# Patient Record
Sex: Female | Born: 1985 | Race: Black or African American | Hispanic: No | Marital: Single | Smoking: Former smoker
Health system: Southern US, Community
[De-identification: ages and names within clinical notes are randomized; demographics above are authoritative.]

## PROBLEM LIST (undated history)

## (undated) ENCOUNTER — Inpatient Hospital Stay (HOSPITAL_COMMUNITY): Payer: Self-pay

## (undated) DIAGNOSIS — F329 Major depressive disorder, single episode, unspecified: Secondary | ICD-10-CM

## (undated) DIAGNOSIS — F32A Depression, unspecified: Secondary | ICD-10-CM

## (undated) DIAGNOSIS — IMO0001 Reserved for inherently not codable concepts without codable children: Secondary | ICD-10-CM

## (undated) DIAGNOSIS — O009 Unspecified ectopic pregnancy without intrauterine pregnancy: Secondary | ICD-10-CM

## (undated) DIAGNOSIS — N920 Excessive and frequent menstruation with regular cycle: Secondary | ICD-10-CM

## (undated) DIAGNOSIS — Z5189 Encounter for other specified aftercare: Secondary | ICD-10-CM

## (undated) DIAGNOSIS — D649 Anemia, unspecified: Secondary | ICD-10-CM

## (undated) HISTORY — DX: Anemia, unspecified: D64.9

## (undated) HISTORY — PX: INDUCED ABORTION: SHX677

---
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
## 2007-09-09 ENCOUNTER — Inpatient Hospital Stay (HOSPITAL_COMMUNITY): Admission: EM | Admit: 2007-09-09 | Discharge: 2007-09-13 | Payer: Self-pay | Admitting: Emergency Medicine

## 2007-10-24 ENCOUNTER — Emergency Department (HOSPITAL_COMMUNITY): Admission: EM | Admit: 2007-10-24 | Discharge: 2007-10-24 | Payer: Self-pay | Admitting: Emergency Medicine

## 2009-05-16 IMAGING — CT CT PELVIS W/ CM
1 of 3 series · 14 of 32 positions shown, 19 images · IV contrast (APPLIED)
Comparison: None

CT ABDOMEN

CLINICAL DATA: Vaginal bleeding, fever, elevated white count

CT ABDOMEN AND PELVIS WITH CONTRAST
TECHNIQUE: Multidetector CT imaging of the abdomen and pelvis was
performed using the standard protocol following bolus
administration of intravenous contrast.
Contrast: 100 ml [id number]

[Series 2: abd/pelv with 5.0 b31f st · axial · 0.71mm/px · z∈[-752,-312]mm · 14 of 98 slices shown, 19 images]
[im 5/98  soft-tissue]
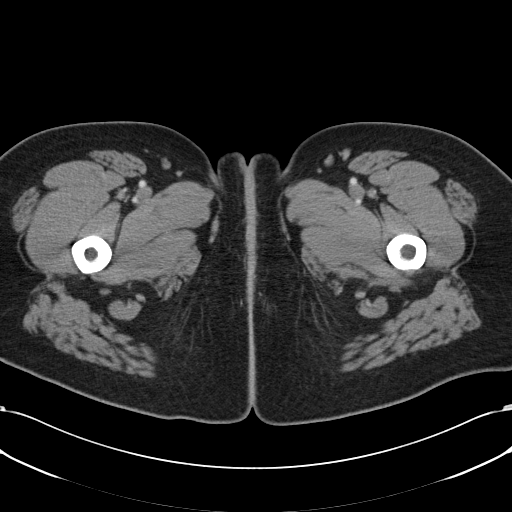
[im 5/98  bone]
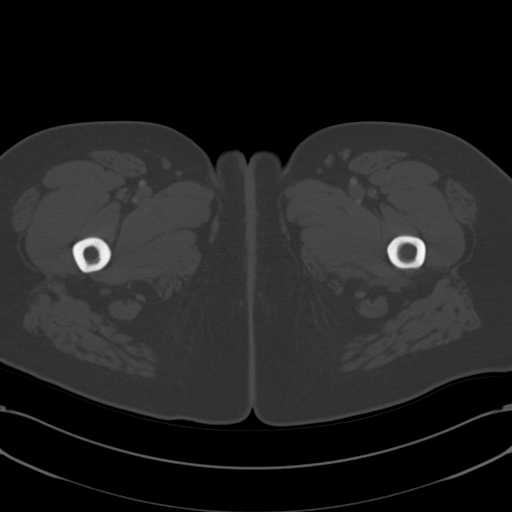
[im 15/98  soft-tissue]
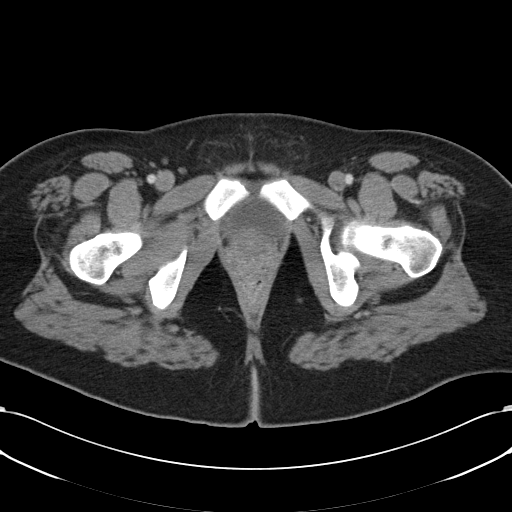
[im 20/98  soft-tissue]
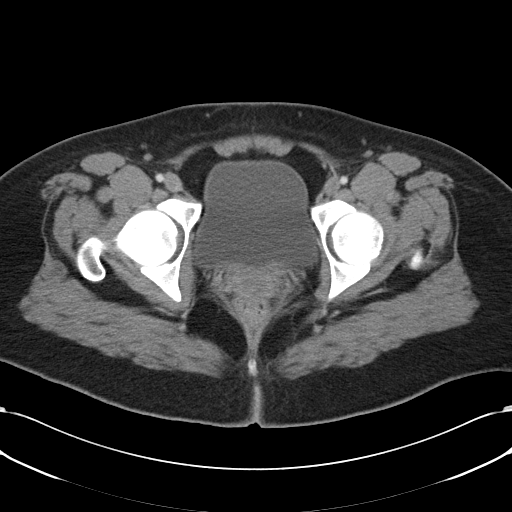
[im 30/98  soft-tissue]
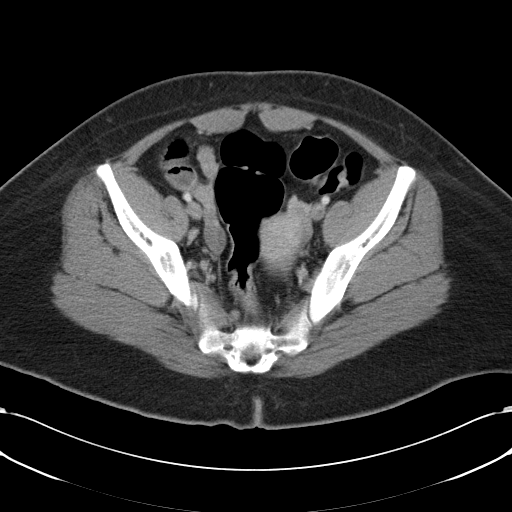
[im 34/98  soft-tissue]
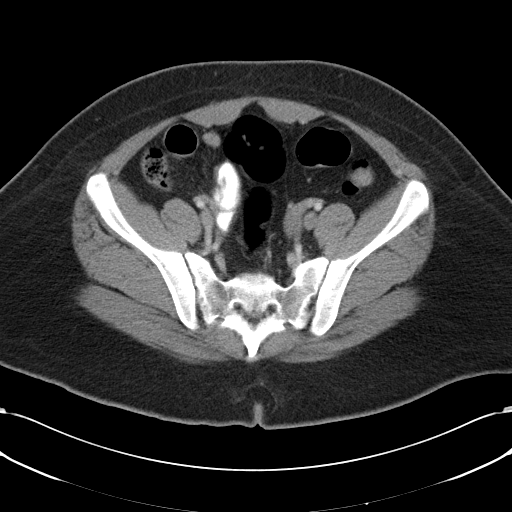
[im 44/98  soft-tissue]
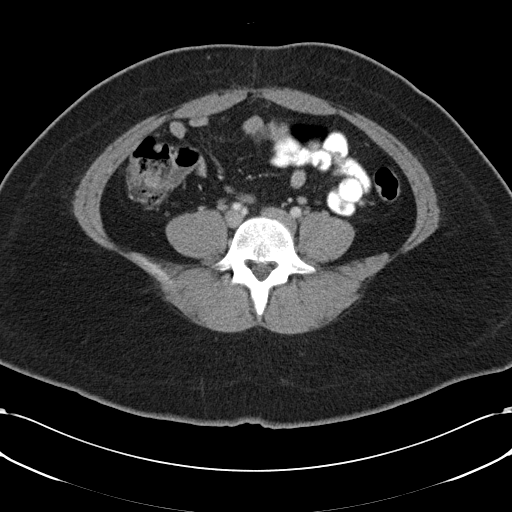
[im 49/98  soft-tissue]
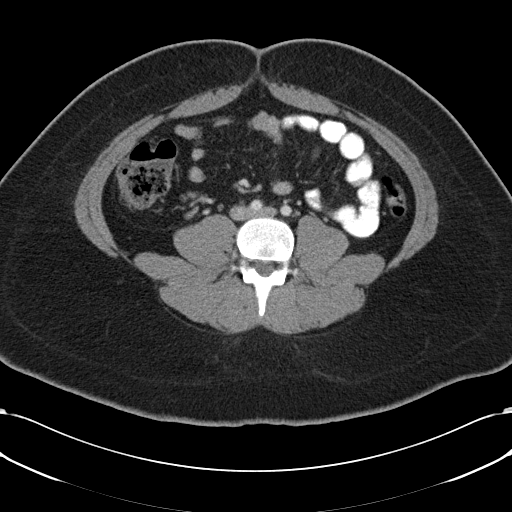
[im 54/98  soft-tissue]
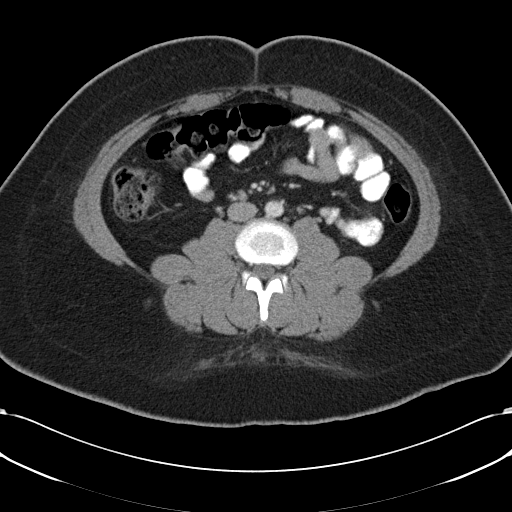
[im 64/98  soft-tissue]
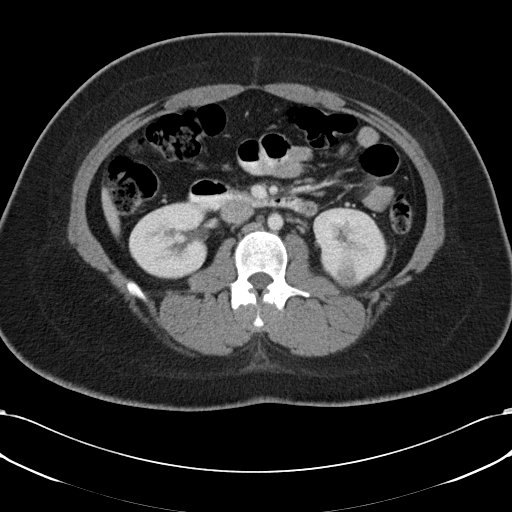
[im 64/98  bone]
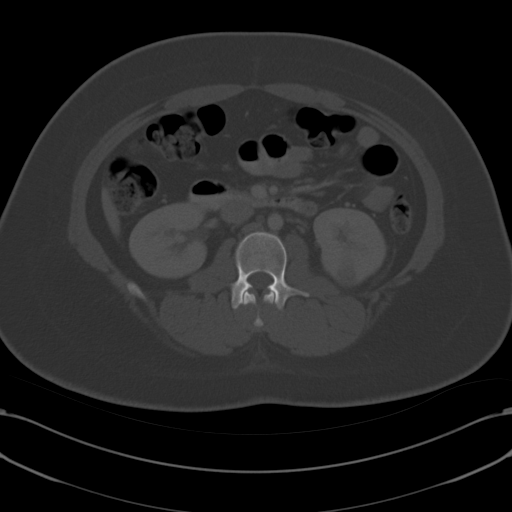
[im 68/98  soft-tissue]
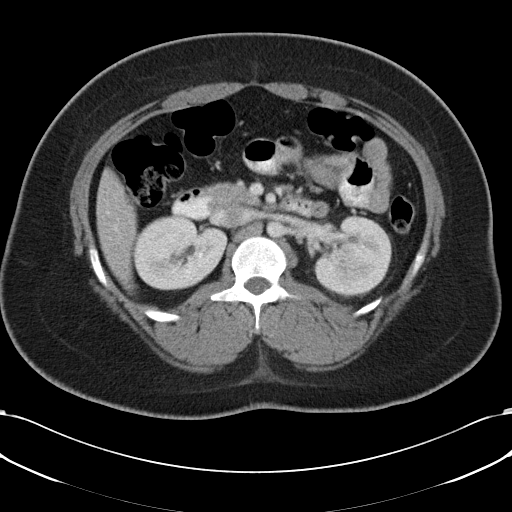
[im 78/98  soft-tissue]
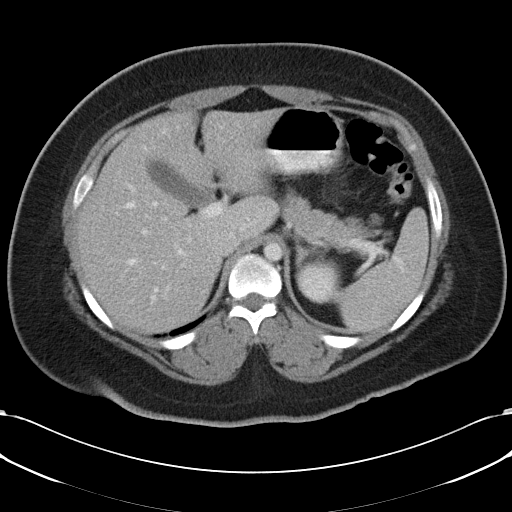
[im 78/98  lung]
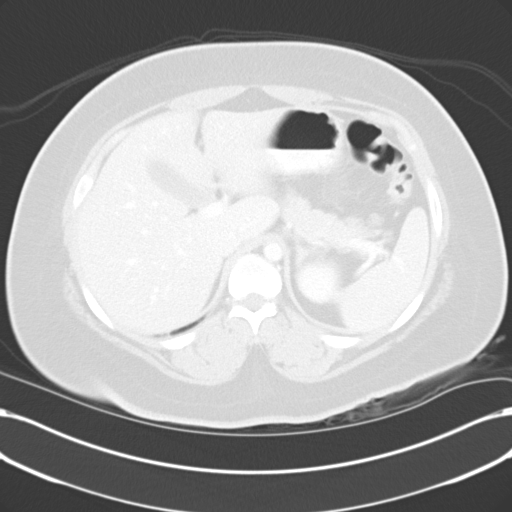
[im 83/98  soft-tissue]
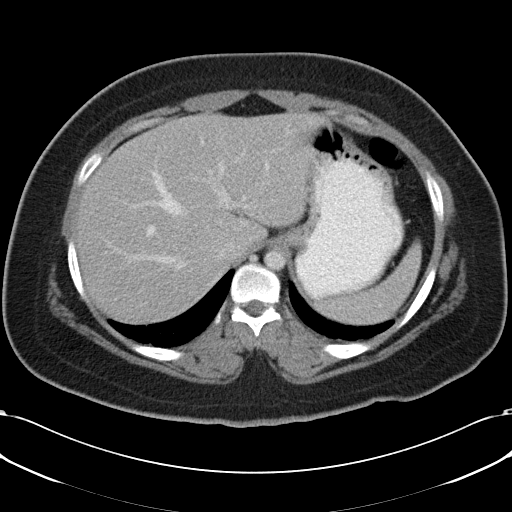
[im 83/98  lung]
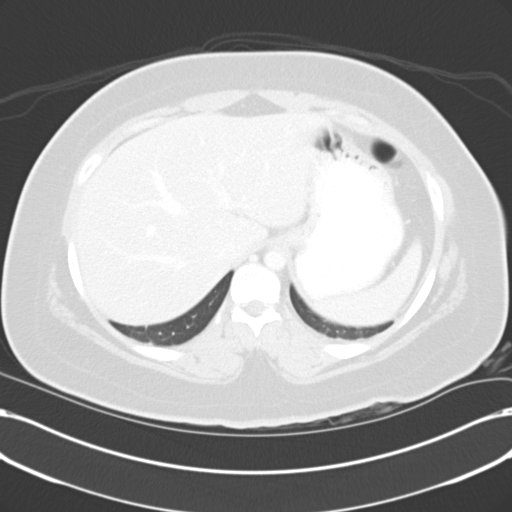
[im 88/98  lung]
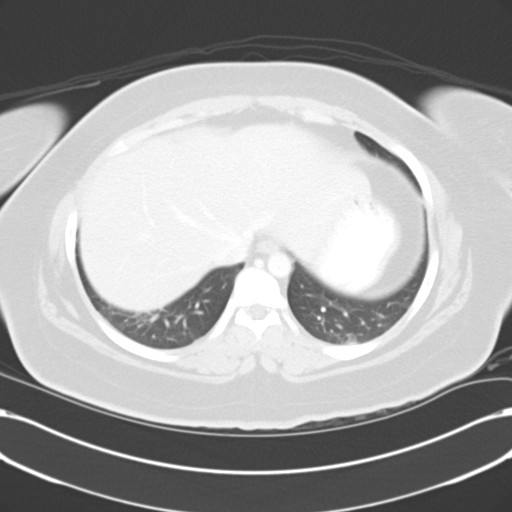
[im 93/98  soft-tissue]
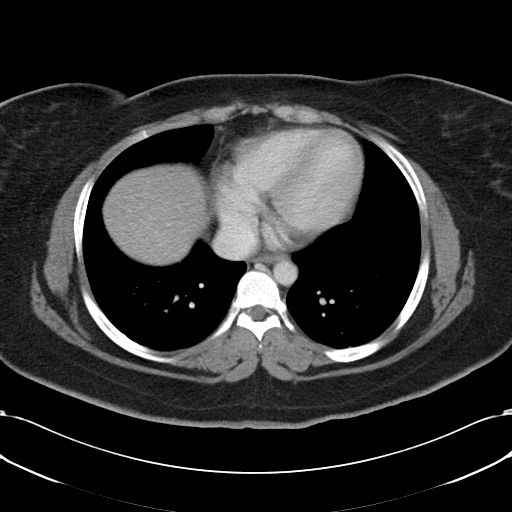
[im 93/98  lung]
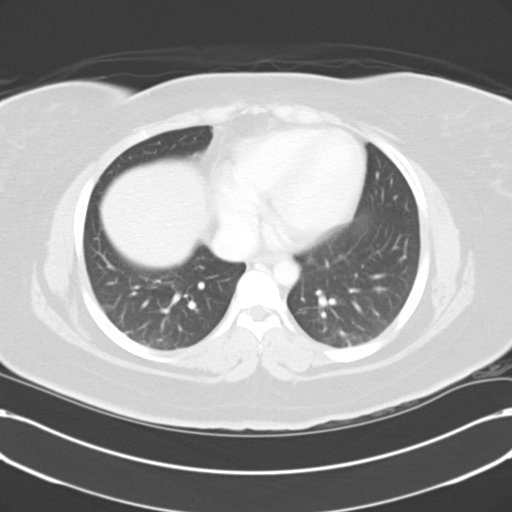

[14 of 32 positions shown; findings below may reference images not displayed]

FINDINGS: There are areas of poor enhancement within the left
kidney, with striated nephrogram pattern, both in the upper pole
and lower pole region.  Surrounding perinephric stranding.
Findings compatible with pyelonephritis.  Right kidney
unremarkable. Liver, spleen, pancreas, adrenals, unremarkable. No
biliary ductal dilatation. Gallbladder grossly unremarkable.

Bowel grossly unremarkable.  No free fluid, free air, or
adenopathy.

Minimal patchy ground-glass densities in the lung bases, likely
atelectasis.  Heart is normal size.  No effusions.  No acute bony
abnormality.
IMPRESSION: Areas of decreased enhancement within the left kidney, with
surrounding perinephric stranding, compatible with pyelonephritis.

CT PELVIS
FINDINGS: Appendix is visualized and is normal.  Fluid seen within
the endometrium.  Mild prominence of the cervix.  Urinary bladder
unremarkable.

Bowel grossly unremarkable.  No free fluid, free air, or
adenopathy.

No acute bony abnormality
IMPRESSION: Endometrial fluid present.  Mild prominence of the cervix.
Recommend correlation with pelvic exam/direct visualization.

## 2009-09-07 ENCOUNTER — Emergency Department (HOSPITAL_COMMUNITY): Admission: EM | Admit: 2009-09-07 | Discharge: 2009-09-07 | Payer: Self-pay | Admitting: Family Medicine

## 2010-07-27 NOTE — H&P (Signed)
[name], [name]             ACCOUNT NO.:  [id number]   MEDICAL RECORD NO.:  [id number]          PATIENT TYPE:  INP   LOCATION:  5151                         FACILITY:  [facility]   PHYSICIAN:  Della Goo, M.D. DATE OF BIRTH:  1985/09/05   DATE OF ADMISSION:  09/08/2007  DATE OF DISCHARGE:                              HISTORY & PHYSICAL   PRIMARY CARE PHYSICIAN:  Unassigned.   CHIEF COMPLAINT:  Vaginal bleeding and abdominal and flank pain.   HISTORY OF PRESENT ILLNESS:  This is a 25 year old female who presents  to the emergency department with complaints of 3 weeks of severe vaginal  bleeding and abdominal pain along with left-sided flank pain.  The  patient reports soaking up to seven pads daily during this time.  She  also reports having dysuria.  She denies having any temperatures;  however, in the emergency department, she was found to have a  temperature of 102.5.   The patient was evaluated in the emergency department and had a GYN  examination performed by the EDP.  Also, a wet prep was performed, and  GC and chlamydia cultures were also sent.   PAST MEDICAL HISTORY:  None.   PAST SURGICAL HISTORY:  None.   MEDICATIONS:  None.   ALLERGIES:  NO KNOWN DRUG ALLERGIES.   SOCIAL HISTORY:  The patient is a nonsmoker, nondrinker.   FAMILY HISTORY:  Noncontributory.   REVIEW OF SYSTEMS:  Pertinents are mentioned above.   PHYSICAL EXAMINATION FINDINGS:  GENERAL:  This is a 25 year old thin,  well-nourished, well-developed female in discomfort but no acute  distress.  VITAL SIGNS:  Her vital signs initially were temperature 102.5, blood  pressure 95/47, heart rate 111, respirations 22, O2 saturations 96-99%.  HEENT:  Normocephalic, atraumatic.  There is no scleral icterus.  Pupils  equally round, reactive to light.  Extraocular movements are intact.  Funduscopic benign.  Oropharynx is clear.  NECK:  Supple.  Full range of motion.  No thyromegaly, adenopathy or  jugular venous distention.  CARDIOVASCULAR:  Mild tachycardia.  No murmurs, gallops or rubs.  LUNGS:  Clear to auscultation bilaterally.  ABDOMEN:  Positive bowel sounds, soft, mildly tender in the left lower  quadrant area.  No rebound, no guarding.  No hepatosplenomegaly.  BACK:  Positive left-sided costovertebral angle tenderness.  No spinous  process tenderness.  EXTREMITIES:  Without cyanosis, clubbing or edema.  NEUROLOGIC:  Nonfocal.  GENITOURINARY:  Examination performed by EDP.   LABORATORY STUDIES:  White blood cell count 17.6, hemoglobin 10.4,  hematocrit 31.1, platelets 391.  Sodium 138, potassium 3.8, chloride  104, bicarbonate 22, BUN 7, creatinine 0.8 and glucose 116.  Urinalysis  with moderate hemoglobin, positive nitrites and moderate leukocyte  esterase.  Urine HCG negative.  Wet prep performed; however, large blood  clots present and vaginal blood in vaginal vault.  CT of the abdomen and  pelvis positive for pyelonephritis on the left side.   ASSESSMENT:  A 25 year old female being admitted with:  1. Pyelonephritis.  2. Abdominal pain/pelvic pain.  3. Sepsis.  4. Menorrhagia.  5. Anemia.   PLAN:  The patient will be admitted.  She has received a dose of  antibiotic therapy with cefoxitin 2 grams IV x1 in the EDP which should  cover any gonorrheal infection.  IV antibiotic therapy of doxycycline  will be continued, and Cipro and Flagyl therapy will be added.  IV  fluids have been ordered for maintenance and fluid resuscitation.  Pain  control therapy will also be administered as needed.  The patient will  be placed on GI and DVT prophylaxis, and GYN will be consulted for  further evaluation of the patient's dysmenorrhea and menorrhagia.      Della Goo, M.D.  Electronically Signed     HJ/MEDQ  D:  09/09/2007  T:  09/09/2007  Job:  [id number]

## 2010-07-27 NOTE — Discharge Summary (Signed)
[name], KIVETT NO.:  [id number]   MEDICAL RECORD NO.:  [id number]          PATIENT TYPE:  INP   LOCATION:  5149                         FACILITY:  [facility]   PHYSICIAN:  Wendy Scott, MD     DATE OF BIRTH:  1985-06-19   DATE OF ADMISSION:  09/08/2007  DATE OF DISCHARGE:  09/13/2007                               DISCHARGE SUMMARY   PRIMARY MEDICAL DOCTOR:  [name].   DISCHARGE DIAGNOSES:  1. Acute pyelonephritis.  2. Symptomatic severe anemia secondary to menorrhagia.  3. 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  4. Genital Chlamydia and gonococcus.   DISCHARGE MEDICATIONS:  1. Ferrous sulfate 325 mg p.o. b.i.d.  2. Cipro 500 mg p.o. b.i.d.  3. Doxycycline 100 mg p.o. b.i.d.   PROCEDURES:  1. CT of the abdomen with contrast:  Areas of decreased enhancement      within the left kidney with surrounding perinephric stranding,      compatible with pyelonephritis.  2. CT of the pelvis:  Endometrial fluid presenting mild prominence of      the cervix.   PERTINENT LABS:  Urine culture, no growth.  H&H 10.5/30.8.  Blood  cultures are negative to date.  RPR negative.  GC probe genital,  positive.  Chlamydia probe genital, positive.  TSH 0.882.  Anemia panel  with absolute reticulocytes 74.1.  Iron less than 10.  B12 365.  Serum  folate 14.2, ferritin 119.  Urinalysis on admission with 7-10 white  blood cells per high-powered field and many bacteria.  Wet prep was  negative for yeast, Trichomonas, or clue cells.  Urine pregnancy test  negative.   CONSULTATIONS:  OB/GYN, Dr. Zelphia Oconnor.   HOSPITAL COURSE/PATIENT DISPOSITION:  Please refer to the history and  physical note for initial admission details.  In summary, [name] is  a pleasant 25 year old African American female patient with no past  medical history, who presented with three weeks of severe vaginal  bleeding and abdominal pain to the left flank region.  She also had  dysuria and  a history of menorrhagia.  In the emergency room, she had a  high temperature of 102.5 degrees Fahrenheit.  Further evaluation  revealed a white cell count of 17.1.  Urinalysis suggestive of urinary  tract infection and a CT abdomen and pelvis also suggesting left  pyelonephritis.  The patient was thereby admitted for further evaluation  and management.   1. Acute pyelonephritis:  Patient was admitted to the hospital.  She      was placed on empiric antibiotics initially of doxycycline,      ciprofloxacin, and Flagyl IV.  She was also hydrated with IV      fluids.  With these measures, the patient has progressively done      well.  Patient is to complete total of 7 days of oral      ciprofloxacin.   1. Symptomatic severe anemia:  This was most likely secondary to her      menorrhagia with hemoglobins dropping to as low as 8.9.  Patient      was transfused 2 units  of packed red blood cells, and H&H's are      stable.  She is started on iron supplements.   1. Menorrhagia secondary to chronic anovulation:  OB/GYN was      consulted.  They suggested this was secondary to chronic      anovulation.  The patient was provided a dose of Depo-Provera.  Her      vaginal bleeding is much better, and she has spotting at times now.      She is to follow up with Dr. Renaldo Oconnor as an outpatient for Pap smear      and further workup and management.  She has been started on iron      supplements.   1. Genital Chlamydia & GC positive:  After discussing with OB/GYN, the      patient was provided with a single dose of IM ceftriaxone 250 mg.      She is to complete a total 14-day course of oral doxycycline.   Patient does not have a primary medical doctor.  We will provide the  name of a physician for her to follow up with a CBC.  Patient is also to  follow up with OB/GYN.      Wendy Scott, MD  Electronically Signed     AH/MEDQ  D:  09/13/2007  T:  09/13/2007  Job:  [id number]   cc:   [name], M.D.  Wendy Cairo, MD

## 2010-11-15 ENCOUNTER — Inpatient Hospital Stay (INDEPENDENT_AMBULATORY_CARE_PROVIDER_SITE_OTHER)
Admission: RE | Admit: 2010-11-15 | Discharge: 2010-11-15 | Disposition: A | Discharge status: Home / Self Care | Payer: Self-pay | Source: Ambulatory Visit | Attending: Family Medicine | Admitting: Family Medicine

## 2010-11-15 DIAGNOSIS — J029 Acute pharyngitis, unspecified: Secondary | ICD-10-CM

## 2010-12-09 LAB — URINALYSIS, ROUTINE W REFLEX MICROSCOPIC
Bilirubin Urine: NEGATIVE
Bilirubin Urine: NEGATIVE
Ketones, ur: 15 — AB
Nitrite: NEGATIVE
Nitrite: POSITIVE — AB
Specific Gravity, Urine: 1.007
Specific Gravity, Urine: 1.024
Urobilinogen, UA: 1
Urobilinogen, UA: 1
pH: 6
pH: 6

## 2010-12-09 LAB — CBC
HCT: 23 — ABNORMAL LOW
HCT: 23.6 — ABNORMAL LOW
HCT: 31.1 — ABNORMAL LOW
HCT: 31.2 — ABNORMAL LOW
Hemoglobin: 10.4 — ABNORMAL LOW
Hemoglobin: 8 — ABNORMAL LOW
MCHC: 33.2
MCHC: 33.5
MCHC: 33.8
MCHC: 35
MCV: 75.3 — ABNORMAL LOW
MCV: 76.2 — ABNORMAL LOW
MCV: 77.3 — ABNORMAL LOW
MCV: 79.2
Platelets: 274
Platelets: 291
Platelets: 319
Platelets: 391
RBC: 3.09 — ABNORMAL LOW
RBC: 4.14
RDW: 15.2
RDW: 15.4
WBC: 13.9 — ABNORMAL HIGH
WBC: 17.6 — ABNORMAL HIGH

## 2010-12-09 LAB — CROSSMATCH: Antibody Screen: NEGATIVE

## 2010-12-09 LAB — URINALYSIS, MICROSCOPIC ONLY
Bilirubin Urine: NEGATIVE
Glucose, UA: NEGATIVE
Ketones, ur: NEGATIVE
Nitrite: NEGATIVE
Protein, ur: 30 — AB
Specific Gravity, Urine: 1.018
Urobilinogen, UA: 1
pH: 6

## 2010-12-09 LAB — DIFFERENTIAL
Basophils Relative: 0
Eosinophils Relative: 0
Monocytes Absolute: 1.6 — ABNORMAL HIGH
Monocytes Relative: 12
Neutro Abs: 10.5 — ABNORMAL HIGH

## 2010-12-09 LAB — POCT I-STAT, CHEM 8
BUN: 7
Calcium, Ion: 1.14
Chloride: 104
Creatinine, Ser: 0.8
Glucose, Bld: 116 — ABNORMAL HIGH
HCT: 26 — ABNORMAL LOW
Hemoglobin: 8.8 — ABNORMAL LOW
Potassium: 3.8
Sodium: 138
TCO2: 22

## 2010-12-09 LAB — URINE MICROSCOPIC-ADD ON

## 2010-12-09 LAB — BASIC METABOLIC PANEL
BUN: 4 — ABNORMAL LOW
CO2: 22
CO2: 26
Calcium: 8.1 — ABNORMAL LOW
Chloride: 103
Chloride: 105
Creatinine, Ser: 0.73
GFR calc Af Amer: >60
Glucose, Bld: 87
Glucose, Bld: 95
Potassium: 3.6
Sodium: 137

## 2010-12-09 LAB — CULTURE, BLOOD (ROUTINE X 2)
Culture: NO GROWTH
Culture: NO GROWTH
Culture: NO GROWTH
Culture: NO GROWTH

## 2010-12-09 LAB — WET PREP, GENITAL
Clue Cells Wet Prep HPF POC: NONE SEEN
Trich, Wet Prep: NONE SEEN
Yeast Wet Prep HPF POC: NONE SEEN

## 2010-12-09 LAB — GC/CHLAMYDIA PROBE AMP, GENITAL
Chlamydia, DNA Probe: POSITIVE — AB
GC Probe Amp, Genital: POSITIVE — AB

## 2010-12-09 LAB — HEMOGLOBIN AND HEMATOCRIT, BLOOD
HCT: 30 — ABNORMAL LOW
Hemoglobin: 8.2 — ABNORMAL LOW

## 2010-12-09 LAB — URINE CULTURE

## 2010-12-09 LAB — TSH: TSH: 0.882

## 2010-12-09 LAB — IRON AND TIBC
Iron: <10 — ABNORMAL LOW
UIBC: 273

## 2010-12-09 LAB — VITAMIN B12: Vitamin B-12: 365 (ref 211–911)

## 2010-12-09 LAB — POCT PREGNANCY, URINE
Operator id: 151321
Preg Test, Ur: NEGATIVE

## 2010-12-09 LAB — TRANSFUSION REACTION
DAT C3: NEGATIVE
Post RXN DAT IgG: NEGATIVE

## 2010-12-09 LAB — ABO/RH: ABO/RH(D): AB POS

## 2010-12-09 LAB — RETICULOCYTES: Retic Ct Pct: 2.1

## 2010-12-09 LAB — RPR: RPR Ser Ql: NONREACTIVE

## 2010-12-09 LAB — FERRITIN: Ferritin: 119 (ref 10–291)

## 2010-12-10 LAB — URINALYSIS, ROUTINE W REFLEX MICROSCOPIC
Glucose, UA: NEGATIVE
Hgb urine dipstick: NEGATIVE
Protein, ur: NEGATIVE
Specific Gravity, Urine: 1.022
pH: 5.5

## 2010-12-10 LAB — POCT PREGNANCY, URINE: Preg Test, Ur: NEGATIVE

## 2010-12-24 LAB — POCT H PYLORI SCREEN: H. PYLORI SCREEN, POC: NEGATIVE

## 2011-03-15 HISTORY — PX: WISDOM TOOTH EXTRACTION: SHX21

## 2011-04-04 ENCOUNTER — Inpatient Hospital Stay (HOSPITAL_COMMUNITY): Admission: AD | Admit: 2011-04-04 | Payer: Self-pay | Source: Ambulatory Visit | Admitting: Obstetrics & Gynecology

## 2011-09-18 ENCOUNTER — Inpatient Hospital Stay (HOSPITAL_COMMUNITY)
Admission: AD | Admit: 2011-09-18 | Discharge: 2011-09-18 | Disposition: A | Discharge status: Home / Self Care | Payer: Medicaid Other | Source: Ambulatory Visit | Attending: Obstetrics & Gynecology | Admitting: Obstetrics & Gynecology

## 2011-09-18 ENCOUNTER — Inpatient Hospital Stay (HOSPITAL_COMMUNITY): Payer: Medicaid Other

## 2011-09-18 ENCOUNTER — Encounter (HOSPITAL_COMMUNITY): Payer: Self-pay | Admitting: *Deleted

## 2011-09-18 DIAGNOSIS — N390 Urinary tract infection, site not specified: Secondary | ICD-10-CM | POA: Insufficient documentation

## 2011-09-18 DIAGNOSIS — O98819 Other maternal infectious and parasitic diseases complicating pregnancy, unspecified trimester: Secondary | ICD-10-CM | POA: Insufficient documentation

## 2011-09-18 DIAGNOSIS — N760 Acute vaginitis: Secondary | ICD-10-CM

## 2011-09-18 DIAGNOSIS — R1031 Right lower quadrant pain: Secondary | ICD-10-CM | POA: Insufficient documentation

## 2011-09-18 DIAGNOSIS — B9689 Other specified bacterial agents as the cause of diseases classified elsewhere: Secondary | ICD-10-CM | POA: Insufficient documentation

## 2011-09-18 DIAGNOSIS — A5901 Trichomonal vulvovaginitis: Secondary | ICD-10-CM

## 2011-09-18 DIAGNOSIS — O2340 Unspecified infection of urinary tract in pregnancy, unspecified trimester: Secondary | ICD-10-CM

## 2011-09-18 DIAGNOSIS — A499 Bacterial infection, unspecified: Secondary | ICD-10-CM | POA: Insufficient documentation

## 2011-09-18 DIAGNOSIS — O2390 Unspecified genitourinary tract infection in pregnancy, unspecified trimester: Secondary | ICD-10-CM | POA: Insufficient documentation

## 2011-09-18 HISTORY — DX: Encounter for other specified aftercare: Z51.89

## 2011-09-18 HISTORY — DX: Reserved for inherently not codable concepts without codable children: IMO0001

## 2011-09-18 LAB — WET PREP, GENITAL: Yeast Wet Prep HPF POC: NONE SEEN

## 2011-09-18 LAB — CBC
HCT: 35.2 % — ABNORMAL LOW (ref 36.0–46.0)
Hemoglobin: 11.7 g/dL — ABNORMAL LOW (ref 12.0–15.0)
MCHC: 33.2 g/dL (ref 30.0–36.0)
MCV: 78.9 fL (ref 78.0–100.0)
RDW: 14.1 % (ref 11.5–15.5)

## 2011-09-18 LAB — URINALYSIS, ROUTINE W REFLEX MICROSCOPIC
Nitrite: POSITIVE — AB
Protein, ur: NEGATIVE mg/dL
Specific Gravity, Urine: >1.03 — ABNORMAL HIGH (ref 1.005–1.030)
Urobilinogen, UA: 0.2 mg/dL (ref 0.0–1.0)

## 2011-09-18 LAB — POCT PREGNANCY, URINE: Preg Test, Ur: POSITIVE — AB

## 2011-09-18 LAB — URINE MICROSCOPIC-ADD ON

## 2011-09-18 LAB — HCG, QUANTITATIVE, PREGNANCY: hCG, Beta Chain, Quant, S: 28511 m[IU]/mL — ABNORMAL HIGH (ref <5)

## 2011-09-18 MED ORDER — CEPHALEXIN 500 MG PO CAPS: 500.0000 mg | ORAL_CAPSULE | Freq: Four times a day (QID) | ORAL | Status: AC

## 2011-09-18 MED ORDER — METRONIDAZOLE 500 MG PO TABS: 500.0000 mg | ORAL_TABLET | Freq: Two times a day (BID) | ORAL | Status: AC

## 2011-09-18 NOTE — MAU Note (Signed)
Pt reports "i am having abd pains", reports pain off/on for 2 days and then constant today. Positive home preg test . LMP 07/07/2011 (?)

## 2011-09-18 NOTE — MAU Provider Note (Signed)
History     CSN: [id number]  Arrival date and time: 09/18/11 1928   None     Chief Complaint  Patient presents with  . Abdominal Pain   HPI A 26-year-old G2 P0 010 at 10 weeks and 3 days who presents the MAU with two-day onset of cramping and nonradiating abdominal pain located in right lower quadrant. The pain rated as 5/10 and intermittent yesterday which is progressed to constant today. No palliating or provoking factors. Patient denies new sexual partners. Patient has a positive home pregnancy test with a LMP of 07/07/2011 giving her an EGA of [redacted] weeks and 3 days.  OB History    Grav Para Term Preterm Abortions TAB SAB Ect Mult Living   2    1 1           Past Medical History  Diagnosis Date  . Blood transfusion     Past Surgical History  Procedure Date  . Induced abortion     Family History  Problem Relation Age of Onset  . Arthritis Father   . Diabetes Father   . Hypertension Father   . Heart disease Father   . Arthritis Maternal Grandmother   . Depression Maternal Grandmother   . Hypertension Maternal Grandmother   . Heart disease Maternal Grandmother   . Depression Maternal Grandfather   . Arthritis Paternal Grandmother   . Cancer Paternal Grandmother   . Depression Paternal Grandmother   . Depression Paternal Grandfather     History  Substance Use Topics  . Smoking status: Never Smoker   . Smokeless tobacco: Not on file  . Alcohol Use: No    Allergies: Allergies not on file  No prescriptions prior to admission    Review of Systems  Constitutional: Negative for fever, chills and diaphoresis.  Gastrointestinal: Negative for nausea, vomiting, diarrhea, constipation, blood in stool and melena.  Genitourinary: Negative for dysuria and urgency.       She denies vaginal discharge, bleeding.   Physical Exam   Blood pressure 122/64, pulse 68, temperature 99 F (37.2 C), temperature source Oral, resp. rate 18, height 5\' 5"  (1.651 m), weight 82.555 kg  (182 lb), last menstrual period 07/07/2011, SpO2 100.00%.  Physical Exam  Constitutional: She is oriented to person, place, and time. She appears well-developed and well-nourished.  HENT:  Head: Normocephalic and atraumatic.  GI: Soft. She exhibits no distension and no mass. There is tenderness (Right lower quadrant tenderness). There is no rebound and no guarding.  Genitourinary:       Normal external female genitalia. Normal-appearing cervix. White vaginal discharge. Right adnexal tenderness. Negative cervical motion tenderness.  Neurological: She is alert and oriented to person, place, and time.  Skin: Skin is warm and dry.  Psychiatric: She has a normal mood and affect. Her behavior is normal. Judgment and thought content normal.   Results for orders placed during the hospital encounter of 09/18/11 (from the past 24 hour(s))  URINALYSIS, ROUTINE W REFLEX MICROSCOPIC     Status: Abnormal   Collection Time   09/18/11  7:45 PM      Component Value Range   Color, Urine YELLOW  YELLOW   APPearance HAZY (*) CLEAR   Specific Gravity, Urine >1.030 (*) 1.005 - 1.030   pH 6.0  5.0 - 8.0   Glucose, UA NEGATIVE  NEGATIVE mg/dL   Hgb urine dipstick NEGATIVE  NEGATIVE   Bilirubin Urine NEGATIVE  NEGATIVE   Ketones, ur NEGATIVE  NEGATIVE mg/dL  Protein, ur NEGATIVE  NEGATIVE mg/dL   Urobilinogen, UA 0.2  0.0 - 1.0 mg/dL   Nitrite POSITIVE (*) NEGATIVE   Leukocytes, UA SMALL (*) NEGATIVE  URINE MICROSCOPIC-ADD ON     Status: Abnormal   Collection Time   09/18/11  7:45 PM      Component Value Range   Squamous Epithelial / LPF MANY (*) RARE   WBC, UA 7-10  <3 WBC/hpf   Bacteria, UA MANY (*) RARE   Urine-Other MUCOUS PRESENT    POCT PREGNANCY, URINE     Status: Abnormal   Collection Time   09/18/11  7:47 PM      Component Value Range   Preg Test, Ur POSITIVE (*) NEGATIVE  WET PREP, GENITAL     Status: Abnormal   Collection Time   09/18/11  8:05 PM      Component Value Range   Yeast Wet Prep  HPF POC NONE SEEN  NONE SEEN   Trich, Wet Prep FEW (*) NONE SEEN   Clue Cells Wet Prep HPF POC FEW (*) NONE SEEN   WBC, Wet Prep HPF POC FEW (*) NONE SEEN  CBC     Status: Abnormal   Collection Time   09/18/11  8:16 PM      Component Value Range   WBC 13.5 (*) 4.0 - 10.5 K/uL   RBC 4.46  3.87 - 5.11 MIL/uL   Hemoglobin 11.7 (*) 12.0 - 15.0 g/dL   HCT 16.1 (*) 09.6 - 04.5 %   MCV 78.9  78.0 - 100.0 fL   MCH 26.2  26.0 - 34.0 pg   MCHC 33.2  30.0 - 36.0 g/dL   RDW 40.9  81.1 - 91.4 %   Platelets 362  150 - 400 K/uL   *RADIOLOGY REPORT*  Clinical Data: Abdominal pain. Positive pregnancy test.  OBSTETRIC <14 WK [place] AND TRANSVAGINAL OB US  Technique: Both transabdominal and transvaginal ultrasound  examinations were performed for complete evaluation of the  gestation as well as the maternal uterus, adnexal regions, and  pelvic cul-de-sac. Transvaginal technique was performed to assess  early pregnancy.  Comparison: None.  Intrauterine gestational sac: Fundal and round.  Yolk sac: Present  Embryo: Present  Cardiac Activity: Present  Heart Rate: 163 bpm  MSD: mm w d  CRL: 49.2 mm 11 w five d [place] EDC: 04/04/2011  Maternal uterus/adnexae:  Ovaries are within normal limits. No subchorionic hemorrhage. No  free fluid.  IMPRESSION:  Live single intrauterine gestation with an estimated gestational  age of [redacted] weeks and 5 days. Fetal heart rate is 163 beats per  minute  Original Report Authenticated By: Donavan Burnet, M.D.   MAU Course  Procedures  MDM White count normal. Ultrasound showing intrauterine pregnancy. Wet prep positive for Trichomonas and bacterial vaginosis. Urinalysis positive for UTI. Assessment and Plan  #1 trichomonas vaginalis #2 urinary tract infection #3 bacterial vaginosis  Flagyl prescribed for Trichomonas and bacterial vaginosis. Will prescribe Keflex for UTI. Patient sent home with instructions to follow the symptoms worsen.   STINSON, JACOB  JEHIEL 09/18/2011, 8:13 PM

## 2011-09-21 ENCOUNTER — Telehealth (HOSPITAL_COMMUNITY): Payer: Self-pay | Admitting: Obstetrics & Gynecology

## 2011-09-21 NOTE — Telephone Encounter (Signed)
Telephone call to patient regarding positive chlamydia culture, patient not in, left message for her to call.  Patient has not been treated and will need referral to [place] [facility] at [phone].  Report faxed to health department.  

## 2011-10-05 ENCOUNTER — Other Ambulatory Visit (HOSPITAL_COMMUNITY): Payer: Self-pay | Admitting: Nurse Practitioner

## 2011-10-05 DIAGNOSIS — Z3689 Encounter for other specified antenatal screening: Secondary | ICD-10-CM

## 2011-10-05 LAB — OB RESULTS CONSOLE VARICELLA ZOSTER ANTIBODY, IGG: Varicella: IMMUNE

## 2011-11-02 ENCOUNTER — Ambulatory Visit (HOSPITAL_COMMUNITY)
Admission: RE | Admit: 2011-11-02 | Discharge: 2011-11-02 | Disposition: A | Discharge status: Home / Self Care | Payer: Medicaid Other | Source: Ambulatory Visit | Attending: Nurse Practitioner | Admitting: Nurse Practitioner

## 2011-11-02 DIAGNOSIS — O358XX0 Maternal care for other (suspected) fetal abnormality and damage, not applicable or unspecified: Secondary | ICD-10-CM | POA: Insufficient documentation

## 2011-11-02 DIAGNOSIS — Z363 Encounter for antenatal screening for malformations: Secondary | ICD-10-CM | POA: Insufficient documentation

## 2011-11-02 DIAGNOSIS — Z3689 Encounter for other specified antenatal screening: Secondary | ICD-10-CM

## 2011-11-02 DIAGNOSIS — Z1389 Encounter for screening for other disorder: Secondary | ICD-10-CM | POA: Insufficient documentation

## 2011-11-26 ENCOUNTER — Encounter (HOSPITAL_COMMUNITY): Payer: Self-pay | Admitting: *Deleted

## 2011-11-26 ENCOUNTER — Inpatient Hospital Stay (HOSPITAL_COMMUNITY)
Admission: AD | Admit: 2011-11-26 | Discharge: 2011-11-26 | Disposition: A | Discharge status: Home / Self Care | Payer: Medicaid Other | Source: Ambulatory Visit | Attending: Obstetrics | Admitting: Obstetrics

## 2011-11-26 DIAGNOSIS — N949 Unspecified condition associated with female genital organs and menstrual cycle: Secondary | ICD-10-CM

## 2011-11-26 DIAGNOSIS — L293 Anogenital pruritus, unspecified: Secondary | ICD-10-CM | POA: Insufficient documentation

## 2011-11-26 DIAGNOSIS — R109 Unspecified abdominal pain: Secondary | ICD-10-CM | POA: Insufficient documentation

## 2011-11-26 DIAGNOSIS — B373 Candidiasis of vulva and vagina: Secondary | ICD-10-CM

## 2011-11-26 DIAGNOSIS — O2390 Unspecified genitourinary tract infection in pregnancy, unspecified trimester: Secondary | ICD-10-CM | POA: Insufficient documentation

## 2011-11-26 DIAGNOSIS — B3731 Acute candidiasis of vulva and vagina: Secondary | ICD-10-CM | POA: Insufficient documentation

## 2011-11-26 LAB — URINALYSIS, ROUTINE W REFLEX MICROSCOPIC
Glucose, UA: NEGATIVE mg/dL
Hgb urine dipstick: NEGATIVE
Ketones, ur: NEGATIVE mg/dL
pH: 6 (ref 5.0–8.0)

## 2011-11-26 LAB — WET PREP, GENITAL
Trich, Wet Prep: NONE SEEN
Yeast Wet Prep HPF POC: NONE SEEN

## 2011-11-26 MED ORDER — MICONAZOLE NITRATE 2 % VA CREA: 1.0000 | TOPICAL_CREAM | Freq: Every day | VAGINAL | Status: AC

## 2011-11-26 MED ORDER — FLUCONAZOLE 150 MG PO TABS
150.0000 mg | ORAL_TABLET | ORAL | Status: AC
  Administered 2011-11-26: 150 mg via ORAL
  Filled 2011-11-26: qty 1

## 2011-11-26 NOTE — Progress Notes (Signed)
Toco d/ced by Sharen Counter CNM

## 2011-11-26 NOTE — MAU Note (Signed)
Awoke to sharp pain in lower abd. Went to BR and then started having a lot of itching down there. Abd. Pain comes and goes but itching is getting worse.

## 2011-11-26 NOTE — MAU Provider Note (Signed)
Chief Complaint:  Abdominal Pain and Vaginal Itching   First Provider Initiated Contact with Patient 11/26/11 [phone]      HPI: [name] is a 26 y.o. G2P0010 at 2w4dwho presents to maternity admissions reporting single episode of sharp inguinal pain which woke her up from sleep.  This pain resolved after she went to the restroom but then she started having severe vaginal itching. She reports no new sexual partners but reports she is not sure if her partner is faithful to her.  She reports good fetal movement, denies LOF, vaginal bleeding, urinary symptoms, h/a, dizziness, n/v, or fever/chills.    Pregnancy Course:  Pt diagnosed with chlamydia during this pregnancy and she and partner were treated at health dept.  Past Medical History: Past Medical History  Diagnosis Date  . Blood transfusion     Past obstetric history:   Past Surgical History: Past Surgical History  Procedure Date  . Induced abortion     Family History: Family History  Problem Relation Age of Onset  . Arthritis Father   . Diabetes Father   . Hypertension Father   . Heart disease Father   . Arthritis Maternal Grandmother   . Depression Maternal Grandmother   . Hypertension Maternal Grandmother   . Heart disease Maternal Grandmother   . Depression Maternal Grandfather   . Arthritis Paternal Grandmother   . Cancer Paternal Grandmother   . Depression Paternal Grandmother   . Depression Paternal Grandfather     Social History: History  Substance Use Topics  . Smoking status: Never Smoker   . Smokeless tobacco: Not on file  . Alcohol Use: No    Allergies: No Known Allergies  Meds:  Prescriptions prior to admission  Medication Sig Dispense Refill  . Prenatal Vit-Fe Fumarate-FA (PRENATAL MULTIVITAMIN) TABS Take 1 tablet by mouth daily.      . benzocaine (ORAJEL) 10 % mucosal gel Use as directed 1 application in the mouth or throat as needed. For tooth pain        ROS: Pertinent findings in  history of present illness.  Physical Exam  Blood pressure 117/72, pulse 61, temperature 98 F (36.7 C), temperature source Oral, resp. rate 20, height 5\' 2"  (1.575 m), weight 84.641 kg (186 lb 9.6 oz), last menstrual period 07/07/2011. GENERAL: Well-developed, well-nourished female in no acute distress.  HEENT: normocephalic HEART: normal rate RESP: normal effort ABDOMEN: Soft, non-tender, gravid appropriate for gestational age EXTREMITIES: Nontender, no edema NEURO: alert and oriented Pelvic exam: Cervix pink, visually closed, without lesion, moderate amount thick yellow discharge, vaginal walls and external genitalia normal  Dilation: Closed/long/high Exam by:: Collene Gobble -Kirby CNM     Labs: Results for orders placed during the hospital encounter of 11/26/11 (from the past 24 hour(s))  URINALYSIS, ROUTINE W REFLEX MICROSCOPIC     Status: Abnormal   Collection Time   11/26/11  6:15 AM      Component Value Range   Color, Urine YELLOW  YELLOW   APPearance HAZY (*) CLEAR   Specific Gravity, Urine 1.020  1.005 - 1.030   pH 6.0  5.0 - 8.0   Glucose, UA NEGATIVE  NEGATIVE mg/dL   Hgb urine dipstick NEGATIVE  NEGATIVE   Bilirubin Urine NEGATIVE  NEGATIVE   Ketones, ur NEGATIVE  NEGATIVE mg/dL   Protein, ur NEGATIVE  NEGATIVE mg/dL   Urobilinogen, UA 0.2  0.0 - 1.0 mg/dL   Nitrite NEGATIVE  NEGATIVE   Leukocytes, UA SMALL (*) NEGATIVE  URINE  MICROSCOPIC-ADD ON     Status: Abnormal   Collection Time   11/26/11  6:15 AM      Component Value Range   Squamous Epithelial / LPF MANY (*) RARE   WBC, UA 3-6  <3 WBC/hpf   RBC / HPF 0-2  <3 RBC/hpf   Bacteria, UA FEW (*) RARE  WET PREP, GENITAL     Status: Abnormal   Collection Time   11/26/11  6:50 AM      Component Value Range   Yeast Wet Prep HPF POC NONE SEEN  NONE SEEN   Trich, Wet Prep NONE SEEN  NONE SEEN   Clue Cells Wet Prep HPF POC FEW (*) NONE SEEN   WBC, Wet Prep HPF POC FEW (*) NONE SEEN     ED Course Diflucan  150 mg PO x1 dose in MAU  Assessment: 1. Round ligament pain   2. Vaginal candida     Plan: Discharge home GC/Chlamydia pending Urine culture pending PTL precautions given Warm bath, ice, tylenol for round ligament pain Drink plenty of fluids Discussed risks of STDs in pregnancy, importance of prevention with condoms if partner unfaithful F/U with Dr Gaynell Face Return to MAU as needed    Sharen Counter Certified Nurse-Midwife 11/26/2011 6:59 AM

## 2011-11-27 LAB — URINE CULTURE: Colony Count: 75000

## 2011-11-28 LAB — GC/CHLAMYDIA PROBE AMP, GENITAL: GC Probe Amp, Genital: NEGATIVE

## 2011-12-13 ENCOUNTER — Encounter (HOSPITAL_COMMUNITY): Payer: Self-pay | Admitting: *Deleted

## 2011-12-13 ENCOUNTER — Inpatient Hospital Stay (HOSPITAL_COMMUNITY)
Admission: AD | Admit: 2011-12-13 | Discharge: 2011-12-14 | Disposition: A | Discharge status: Home / Self Care | Payer: Medicaid Other | Source: Ambulatory Visit | Attending: Obstetrics | Admitting: Obstetrics

## 2011-12-13 DIAGNOSIS — O26899 Other specified pregnancy related conditions, unspecified trimester: Secondary | ICD-10-CM

## 2011-12-13 DIAGNOSIS — R12 Heartburn: Secondary | ICD-10-CM | POA: Insufficient documentation

## 2011-12-13 DIAGNOSIS — O99891 Other specified diseases and conditions complicating pregnancy: Secondary | ICD-10-CM | POA: Insufficient documentation

## 2011-12-13 DIAGNOSIS — R109 Unspecified abdominal pain: Secondary | ICD-10-CM | POA: Insufficient documentation

## 2011-12-13 LAB — URINALYSIS, ROUTINE W REFLEX MICROSCOPIC
Bilirubin Urine: NEGATIVE
Hgb urine dipstick: NEGATIVE
Ketones, ur: NEGATIVE mg/dL
Protein, ur: NEGATIVE mg/dL
Urobilinogen, UA: 0.2 mg/dL (ref 0.0–1.0)

## 2011-12-13 NOTE — MAU Provider Note (Signed)
History     CSN: [id number]  Arrival date and time: 12/13/11 2200   First Provider Initiated Contact with Patient 12/13/11 2322      No chief complaint on file.  HPI [name] 26 y.o. [redacted]w[redacted]d Comes to MAU with blood on tissue when wiping after voiding and upper midline abdominal pain.  OB History    Grav Para Term Preterm Abortions TAB SAB Ect Mult Living   2    1 1     0      Past Medical History  Diagnosis Date  . Blood transfusion     Past Surgical History  Procedure Date  . Induced abortion     Family History  Problem Relation Age of Onset  . Arthritis Father   . Diabetes Father   . Hypertension Father   . Heart disease Father   . Arthritis Maternal Grandmother   . Depression Maternal Grandmother   . Hypertension Maternal Grandmother   . Heart disease Maternal Grandmother   . Depression Maternal Grandfather   . Arthritis Paternal Grandmother   . Cancer Paternal Grandmother   . Depression Paternal Grandmother   . Depression Paternal Grandfather     History  Substance Use Topics  . Smoking status: Never Smoker   . Smokeless tobacco: Not on file  . Alcohol Use: No    Allergies: No Known Allergies  Prescriptions prior to admission  Medication Sig Dispense Refill  . Prenatal Vit-Fe Fumarate-FA (PRENATAL MULTIVITAMIN) TABS Take 1 tablet by mouth daily.      Marland Kitchen acetaminophen-codeine (TYLENOL #3) 300-30 MG per tablet Take 2 tablets by mouth 2 (two) times daily as needed. For pain        Review of Systems  Gastrointestinal: Positive for abdominal pain. Negative for nausea and vomiting.  Genitourinary:       No vaginal discharge. Vaginal bleeding. No dysuria.   Physical Exam   Blood pressure 114/62, pulse 86, temperature 98 F (36.7 C), temperature source Oral, resp. rate 18, height 5\' 3"  (1.6 m), weight 87.147 kg (192 lb 2 oz), last menstrual period 07/07/2011.  Physical Exam  Nursing note and vitals reviewed. Constitutional: She is oriented  to person, place, and time. She appears well-developed and well-nourished.  HENT:  Head: Normocephalic.  Eyes: EOM are normal.  Neck: Neck supple.  GI: Soft. There is no tenderness.       Pain has improved at present, but was having upper midline abdominal pain  Genitourinary:       Speculum exam - no blood seen Cervix closed and thick  Musculoskeletal: Normal range of motion.  Neurological: She is alert and oriented to person, place, and time.  Skin: Skin is warm and dry.  Psychiatric: She has a normal mood and affect.    MAU Course  Procedures Results for orders placed during the hospital encounter of 12/13/11 (from the past 24 hour(s))  URINALYSIS, ROUTINE W REFLEX MICROSCOPIC     Status: Abnormal   Collection Time   12/13/11 10:15 PM      Component Value Range   Color, Urine YELLOW  YELLOW   APPearance CLEAR  CLEAR   Specific Gravity, Urine >1.030 (*) 1.005 - 1.030   pH 6.0  5.0 - 8.0   Glucose, UA NEGATIVE  NEGATIVE mg/dL   Hgb urine dipstick NEGATIVE  NEGATIVE   Bilirubin Urine NEGATIVE  NEGATIVE   Ketones, ur NEGATIVE  NEGATIVE mg/dL   Protein, ur NEGATIVE  NEGATIVE mg/dL   Urobilinogen,  UA 0.2  0.0 - 1.0 mg/dL   Nitrite NEGATIVE  NEGATIVE   Leukocytes, UA NEGATIVE  NEGATIVE    Assessment and Plan  Heartburn in pregnancy  Plan Drink at least 8 8-oz glasses of water every day. Take Tums by mouth for upper abdominal pain. Do not eat high fat foods. Keep your appointments in the office.  Snow Peoples 12/13/2011, 11:42 PM

## 2011-12-13 NOTE — Progress Notes (Signed)
Pt states is no longer having pain in upper abd that brought her in. Now just has heartburn which has a lot and is diff from pain that brought her in.

## 2011-12-13 NOTE — MAU Note (Signed)
PT SAYS  AT  930PM- SHE WAS LYING DOWN-- SHE STARTED HAVING  UPPER ABD PAIN-- NOW IT IS LESS.Marland Kitchen  SAYS WHEN SHE WIPED - SHE SAW A SPOT OF  BLOOD ON PAPER.Marland Kitchen  LAST SEX-  1 MTH AGO.  DENIES HSV AND  MRSA

## 2012-02-18 ENCOUNTER — Encounter (HOSPITAL_COMMUNITY): Payer: Self-pay | Admitting: *Deleted

## 2012-02-18 ENCOUNTER — Inpatient Hospital Stay (HOSPITAL_COMMUNITY)
Admission: AD | Admit: 2012-02-18 | Discharge: 2012-02-18 | Disposition: A | Discharge status: Home / Self Care | Payer: Medicaid Other | Source: Ambulatory Visit | Attending: Obstetrics | Admitting: Obstetrics

## 2012-02-18 ENCOUNTER — Inpatient Hospital Stay (HOSPITAL_COMMUNITY): Payer: Medicaid Other

## 2012-02-18 DIAGNOSIS — O479 False labor, unspecified: Secondary | ICD-10-CM

## 2012-02-18 DIAGNOSIS — R1084 Generalized abdominal pain: Secondary | ICD-10-CM

## 2012-02-18 DIAGNOSIS — O99891 Other specified diseases and conditions complicating pregnancy: Secondary | ICD-10-CM | POA: Insufficient documentation

## 2012-02-18 DIAGNOSIS — R109 Unspecified abdominal pain: Secondary | ICD-10-CM | POA: Insufficient documentation

## 2012-02-18 DIAGNOSIS — S3991XA Unspecified injury of abdomen, initial encounter: Secondary | ICD-10-CM

## 2012-02-18 DIAGNOSIS — IMO0002 Reserved for concepts with insufficient information to code with codable children: Secondary | ICD-10-CM | POA: Insufficient documentation

## 2012-02-18 LAB — URINALYSIS, ROUTINE W REFLEX MICROSCOPIC
Bilirubin Urine: NEGATIVE
Glucose, UA: NEGATIVE mg/dL
Hgb urine dipstick: NEGATIVE
Ketones, ur: NEGATIVE mg/dL
Protein, ur: NEGATIVE mg/dL
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
## 2012-03-27 ENCOUNTER — Inpatient Hospital Stay (HOSPITAL_COMMUNITY)
Admission: AD | Admit: 2012-03-27 | Discharge: 2012-03-31 | DRG: 766 | Disposition: A | Discharge status: Home / Self Care | Payer: Medicaid Other | Source: Ambulatory Visit | Attending: Obstetrics | Admitting: Obstetrics

## 2012-03-27 ENCOUNTER — Encounter (HOSPITAL_COMMUNITY): Payer: Self-pay | Admitting: Obstetrics and Gynecology

## 2012-03-27 DIAGNOSIS — D62 Acute posthemorrhagic anemia: Secondary | ICD-10-CM | POA: Diagnosis not present

## 2012-03-27 HISTORY — DX: Excessive and frequent menstruation with regular cycle: N92.0

## 2012-03-27 LAB — CBC
HCT: 34.6 % — ABNORMAL LOW (ref 36.0–46.0)
MCH: 24.2 pg — ABNORMAL LOW (ref 26.0–34.0)
MCHC: 32.7 g/dL (ref 30.0–36.0)
MCV: 74.2 fL — ABNORMAL LOW (ref 78.0–100.0)
Platelets: 308 10*3/uL (ref 150–400)
RDW: 15.5 % (ref 11.5–15.5)

## 2012-03-27 LAB — POCT FERN TEST: POCT Fern Test: POSITIVE

## 2012-03-27 LAB — OB RESULTS CONSOLE RUBELLA ANTIBODY, IGM: Rubella: IMMUNE

## 2012-03-27 LAB — TYPE AND SCREEN: Antibody Screen: NEGATIVE

## 2012-03-27 LAB — OB RESULTS CONSOLE HEPATITIS B SURFACE ANTIGEN: Hepatitis B Surface Ag: NEGATIVE

## 2012-03-27 MED ORDER — ONDANSETRON HCL 4 MG/2ML IJ SOLN: 4.0000 mg | Freq: Four times a day (QID) | INTRAMUSCULAR | Status: DC | PRN

## 2012-03-27 MED ORDER — EPHEDRINE 5 MG/ML INJ
INTRAVENOUS | Status: AC
  Filled 2012-03-27: qty 4

## 2012-03-27 MED ORDER — OXYTOCIN BOLUS FROM INFUSION: 500.0000 mL | INTRAVENOUS | Status: DC

## 2012-03-27 MED ORDER — PHENYLEPHRINE 40 MCG/ML (10ML) SYRINGE FOR IV PUSH (FOR BLOOD PRESSURE SUPPORT): 80.0000 ug | PREFILLED_SYRINGE | INTRAVENOUS | Status: DC | PRN

## 2012-03-27 MED ORDER — BUTORPHANOL TARTRATE 1 MG/ML IJ SOLN
1.0000 mg | INTRAMUSCULAR | Status: DC | PRN
  Administered 2012-03-27 (×2): 1 mg via INTRAVENOUS
  Filled 2012-03-27 (×2): qty 1

## 2012-03-27 MED ORDER — LIDOCAINE HCL (PF) 1 % IJ SOLN: 30.0000 mL | INTRAMUSCULAR | Status: DC | PRN

## 2012-03-27 MED ORDER — OXYTOCIN 40 UNITS IN LACTATED RINGERS INFUSION - SIMPLE MED
1.0000 m[IU]/min | INTRAVENOUS | Status: DC
  Administered 2012-03-27: 2 m[IU]/min via INTRAVENOUS

## 2012-03-27 MED ORDER — PHENYLEPHRINE 40 MCG/ML (10ML) SYRINGE FOR IV PUSH (FOR BLOOD PRESSURE SUPPORT)
PREFILLED_SYRINGE | INTRAVENOUS | Status: AC
  Filled 2012-03-27: qty 5

## 2012-03-27 MED ORDER — LACTATED RINGERS IV SOLN
INTRAVENOUS | Status: DC
  Administered 2012-03-27: 22:00:00 via INTRAVENOUS
  Administered 2012-03-27: 125 mL/h via INTRAVENOUS
  Administered 2012-03-27: 15:00:00 via INTRAVENOUS

## 2012-03-27 MED ORDER — OXYTOCIN 40 UNITS IN LACTATED RINGERS INFUSION - SIMPLE MED: 62.5000 mL/h | INTRAVENOUS | Status: DC

## 2012-03-27 MED ORDER — OXYCODONE-ACETAMINOPHEN 5-325 MG PO TABS: 1.0000 | ORAL_TABLET | ORAL | Status: DC | PRN

## 2012-03-27 MED ORDER — FENTANYL 2.5 MCG/ML BUPIVACAINE 1/10 % EPIDURAL INFUSION (WH - ANES)
14.0000 mL/h | INTRAMUSCULAR | Status: DC
  Administered 2012-03-27 – 2012-03-28 (×3): 14 mL/h via EPIDURAL
  Filled 2012-03-27 (×2): qty 125

## 2012-03-27 MED ORDER — FLEET ENEMA 7-19 GM/118ML RE ENEM: 1.0000 | ENEMA | RECTAL | Status: DC | PRN

## 2012-03-27 MED ORDER — OXYTOCIN 40 UNITS IN LACTATED RINGERS INFUSION - SIMPLE MED
INTRAVENOUS | Status: AC
  Administered 2012-03-27: 2 m[IU]/min via INTRAVENOUS
  Filled 2012-03-27: qty 1000

## 2012-03-27 MED ORDER — ACETAMINOPHEN 325 MG PO TABS: 650.0000 mg | ORAL_TABLET | ORAL | Status: DC | PRN

## 2012-03-27 MED ORDER — EPHEDRINE 5 MG/ML INJ: 10.0000 mg | INTRAVENOUS | Status: DC | PRN

## 2012-03-27 MED ORDER — IBUPROFEN 600 MG PO TABS: 600.0000 mg | ORAL_TABLET | Freq: Four times a day (QID) | ORAL | Status: DC | PRN

## 2012-03-27 MED ORDER — CITRIC ACID-SODIUM CITRATE 334-500 MG/5ML PO SOLN
30.0000 mL | ORAL | Status: DC | PRN
  Administered 2012-03-27 – 2012-03-28 (×2): 30 mL via ORAL
  Filled 2012-03-27 (×2): qty 15

## 2012-03-27 MED ORDER — DIPHENHYDRAMINE HCL 50 MG/ML IJ SOLN: 12.5000 mg | INTRAMUSCULAR | Status: DC | PRN

## 2012-03-27 MED ORDER — LACTATED RINGERS IV SOLN
500.0000 mL | INTRAVENOUS | Status: DC | PRN
  Administered 2012-03-28: 500 mL via INTRAVENOUS

## 2012-03-27 MED ORDER — LACTATED RINGERS IV SOLN: 500.0000 mL | Freq: Once | INTRAVENOUS | Status: DC

## 2012-03-27 MED ORDER — TERBUTALINE SULFATE 1 MG/ML IJ SOLN: 0.2500 mg | Freq: Once | INTRAMUSCULAR | Status: AC | PRN

## 2012-03-27 MED ORDER — LIDOCAINE HCL (PF) 1 % IJ SOLN
INTRAMUSCULAR | Status: DC | PRN
  Administered 2012-03-27 (×2): 5 mL

## 2012-03-27 MED ORDER — FENTANYL 2.5 MCG/ML BUPIVACAINE 1/10 % EPIDURAL INFUSION (WH - ANES)
INTRAMUSCULAR | Status: AC
  Filled 2012-03-27: qty 125

## 2012-03-27 NOTE — H&P (Signed)
This is Dr. Francoise Ceo dictating the history and physical on  [name] she's a 27 year old gravida 2 para 0010 at 51 weeks negative GBS  EDC 04/03/2012 membranes ruptured spontaneously at 9 AM today she has an epidural and has been contracting irregularly an IUPC was inserted and she is now 4 cm 80% vertex -3 and is on low-dose Pitocin Past medical history negative Past surgical history negative you and social history noncontributory System review negative Physical exam well-developed female in labor HEENT negative Breasts negative Heart regular rhythm no murmurs no gallops Lungs clear to P&A Abdomen term Pelvic as described above Extremities negative and

## 2012-03-27 NOTE — MAU Note (Signed)
"  About 0900 this morning I was awaken out of my sleep with my stomach really tight.  When I sat up in the bed, there was a big gush.  My boyfriend asked me if I peed in the bed and I told him no.  He then told me he thought my water broke.  I am still gushing out water now.  (+) FM.  No VB.  I am supposed to go to the doctor this morning."

## 2012-03-27 NOTE — Anesthesia Preprocedure Evaluation (Addendum)
Anesthesia Evaluation  Patient identified by MRN, date of birth, ID band Patient awake    Reviewed: Allergy & Precautions, H&P , Patient's Chart, lab work & pertinent test results  Airway Mallampati: II TM Distance: >3 FB Neck ROM: full    Dental No notable dental hx.    Pulmonary neg pulmonary ROS,  breath sounds clear to auscultation  Pulmonary exam normal       Cardiovascular negative cardio ROS  Rhythm:regular Rate:Normal     Neuro/Psych negative neurological ROS  negative psych ROS   GI/Hepatic negative GI ROS, Neg liver ROS,   Endo/Other  negative endocrine ROS  Renal/GU negative Renal ROS     Musculoskeletal   Abdominal   Peds  Hematology negative hematology ROS (+)   Anesthesia Other Findings   Reproductive/Obstetrics (+) Pregnancy                           Anesthesia Physical Anesthesia Plan  ASA: II and emergent  Anesthesia Plan: Epidural   Post-op Pain Management:    Induction:   Airway Management Planned:   Additional Equipment:   Intra-op Plan:   Post-operative Plan:   Informed Consent: I have reviewed the patients History and Physical, chart, labs and discussed the procedure including the risks, benefits and alternatives for the proposed anesthesia with the patient or authorized representative who has indicated his/her understanding and acceptance.     Plan Discussed with: Anesthesiologist, CRNA and Surgeon  Anesthesia Plan Comments:        Anesthesia Quick Evaluation

## 2012-03-27 NOTE — Anesthesia Procedure Notes (Signed)
Epidural Patient location during procedure: OB Start time: 03/27/2012 3:02 PM  Staffing Anesthesiologist: Angus Seller., Harrell Gave. Performed by: anesthesiologist   Preanesthetic Checklist Completed: patient identified, site marked, surgical consent, pre-op evaluation, timeout performed, IV checked, risks and benefits discussed and monitors and equipment checked  Epidural Patient position: sitting Prep: site prepped and draped and DuraPrep Patient monitoring: continuous pulse ox and blood pressure Approach: midline Injection technique: LOR air and LOR saline  Needle:  Needle type: Tuohy  Needle gauge: 17 G Needle length: 9 cm and 9 Needle insertion depth: 8 cm Catheter type: closed end flexible Catheter size: 19 Gauge Catheter at skin depth: 14 cm Test dose: negative  Assessment Events: blood not aspirated, injection not painful, no injection resistance, negative IV test and no paresthesia  Additional Notes Patient identified.  Risk benefits discussed including failed block, incomplete pain control, headache, nerve damage, paralysis, blood pressure changes, nausea, vomiting, reactions to medication both toxic or allergic, and postpartum back pain.  Patient expressed understanding and wished to proceed.  All questions were answered.  Sterile technique used throughout procedure and epidural site dressed with sterile barrier dressing. No paresthesia or other complications noted.The patient did not experience any signs of intravascular injection such as tinnitus or metallic taste in mouth nor signs of intrathecal spread such as rapid motor block. Please see nursing notes for vital signs.

## 2012-03-28 ENCOUNTER — Encounter (HOSPITAL_COMMUNITY): Payer: Self-pay | Admitting: Anesthesiology

## 2012-03-28 ENCOUNTER — Inpatient Hospital Stay (HOSPITAL_COMMUNITY): Payer: Medicaid Other | Admitting: Anesthesiology

## 2012-03-28 ENCOUNTER — Encounter (HOSPITAL_COMMUNITY): Payer: Self-pay | Admitting: *Deleted

## 2012-03-28 ENCOUNTER — Encounter (HOSPITAL_COMMUNITY)
Admission: AD | Disposition: A | Discharge status: Home / Self Care | Payer: Self-pay | Source: Ambulatory Visit | Attending: Obstetrics

## 2012-03-28 SURGERY — Surgical Case
Anesthesia: Epidural | Site: Abdomen | Wound class: Clean Contaminated

## 2012-03-28 MED ORDER — 0.9 % SODIUM CHLORIDE (POUR BTL) OPTIME
TOPICAL | Status: DC | PRN
  Administered 2012-03-28: 1000 mL

## 2012-03-28 MED ORDER — LACTATED RINGERS IV SOLN
INTRAVENOUS | Status: DC | PRN
  Administered 2012-03-28: 06:00:00 via INTRAVENOUS

## 2012-03-28 MED ORDER — ZOLPIDEM TARTRATE 5 MG PO TABS: 5.0000 mg | ORAL_TABLET | Freq: Every evening | ORAL | Status: DC | PRN

## 2012-03-28 MED ORDER — ONDANSETRON HCL 4 MG/2ML IJ SOLN
4.0000 mg | Freq: Three times a day (TID) | INTRAMUSCULAR | Status: DC | PRN
  Filled 2012-03-28: qty 2

## 2012-03-28 MED ORDER — OXYCODONE-ACETAMINOPHEN 5-325 MG PO TABS
1.0000 | ORAL_TABLET | ORAL | Status: DC | PRN
  Administered 2012-03-29 – 2012-03-30 (×5): 2 via ORAL
  Administered 2012-03-30: 1 via ORAL
  Administered 2012-03-30: 2 via ORAL
  Administered 2012-03-31: 1 via ORAL
  Filled 2012-03-28 (×3): qty 2
  Filled 2012-03-28: qty 1
  Filled 2012-03-28: qty 2
  Filled 2012-03-28: qty 1
  Filled 2012-03-28 (×2): qty 2

## 2012-03-28 MED ORDER — ONDANSETRON HCL 4 MG/2ML IJ SOLN
INTRAMUSCULAR | Status: DC | PRN
  Administered 2012-03-28: 4 mg via INTRAVENOUS

## 2012-03-28 MED ORDER — PHENYLEPHRINE 40 MCG/ML (10ML) SYRINGE FOR IV PUSH (FOR BLOOD PRESSURE SUPPORT)
PREFILLED_SYRINGE | INTRAVENOUS | Status: AC
  Filled 2012-03-28: qty 5

## 2012-03-28 MED ORDER — DIPHENHYDRAMINE HCL 25 MG PO CAPS
25.0000 mg | ORAL_CAPSULE | ORAL | Status: DC | PRN
  Administered 2012-03-29: 25 mg via ORAL
  Filled 2012-03-28: qty 1

## 2012-03-28 MED ORDER — LACTATED RINGERS IV SOLN
INTRAVENOUS | Status: DC
  Administered 2012-03-28: 01:00:00 via INTRAUTERINE

## 2012-03-28 MED ORDER — IBUPROFEN 600 MG PO TABS
600.0000 mg | ORAL_TABLET | Freq: Four times a day (QID) | ORAL | Status: DC
  Administered 2012-03-28 – 2012-03-31 (×13): 600 mg via ORAL
  Filled 2012-03-28 (×12): qty 1

## 2012-03-28 MED ORDER — KETOROLAC TROMETHAMINE 30 MG/ML IJ SOLN
30.0000 mg | Freq: Four times a day (QID) | INTRAMUSCULAR | Status: DC | PRN
  Administered 2012-03-28: 30 mg via INTRAMUSCULAR

## 2012-03-28 MED ORDER — NALBUPHINE HCL 10 MG/ML IJ SOLN
5.0000 mg | INTRAMUSCULAR | Status: DC | PRN
  Administered 2012-03-28 – 2012-03-29 (×3): 5 mg via INTRAVENOUS
  Filled 2012-03-28 (×2): qty 1

## 2012-03-28 MED ORDER — TETANUS-DIPHTH-ACELL PERTUSSIS 5-2.5-18.5 LF-MCG/0.5 IM SUSP
0.5000 mL | Freq: Once | INTRAMUSCULAR | Status: AC
  Administered 2012-03-30: 0.5 mL via INTRAMUSCULAR

## 2012-03-28 MED ORDER — LANOLIN HYDROUS EX OINT: 1.0000 "application " | TOPICAL_OINTMENT | CUTANEOUS | Status: DC | PRN

## 2012-03-28 MED ORDER — FENTANYL CITRATE 0.05 MG/ML IJ SOLN: 25.0000 ug | INTRAMUSCULAR | Status: DC | PRN

## 2012-03-28 MED ORDER — ONDANSETRON HCL 4 MG PO TABS: 4.0000 mg | ORAL_TABLET | ORAL | Status: DC | PRN

## 2012-03-28 MED ORDER — SODIUM CHLORIDE 0.9 % IJ SOLN: 3.0000 mL | INTRAMUSCULAR | Status: DC | PRN

## 2012-03-28 MED ORDER — CEFAZOLIN SODIUM-DEXTROSE 2-3 GM-% IV SOLR
2.0000 g | Freq: Once | INTRAVENOUS | Status: AC
  Administered 2012-03-28: 2 g via INTRAVENOUS
  Filled 2012-03-28: qty 50

## 2012-03-28 MED ORDER — NALOXONE HCL 1 MG/ML IJ SOLN: 1.0000 ug/kg/h | INTRAVENOUS | Status: DC | PRN

## 2012-03-28 MED ORDER — SCOPOLAMINE 1 MG/3DAYS TD PT72
MEDICATED_PATCH | TRANSDERMAL | Status: AC
  Filled 2012-03-28: qty 1

## 2012-03-28 MED ORDER — NALOXONE HCL 0.4 MG/ML IJ SOLN: 0.4000 mg | INTRAMUSCULAR | Status: DC | PRN

## 2012-03-28 MED ORDER — MORPHINE SULFATE (PF) 0.5 MG/ML IJ SOLN
INTRAMUSCULAR | Status: DC | PRN
  Administered 2012-03-28: 4 mg via EPIDURAL
  Administered 2012-03-28: 1 mg via INTRAVENOUS

## 2012-03-28 MED ORDER — LIDOCAINE-EPINEPHRINE (PF) 2 %-1:200000 IJ SOLN
INTRAMUSCULAR | Status: AC
  Filled 2012-03-28: qty 20

## 2012-03-28 MED ORDER — WITCH HAZEL-GLYCERIN EX PADS: 1.0000 "application " | MEDICATED_PAD | CUTANEOUS | Status: DC | PRN

## 2012-03-28 MED ORDER — PRENATAL MULTIVITAMIN CH
1.0000 | ORAL_TABLET | Freq: Every day | ORAL | Status: DC
  Administered 2012-03-29 – 2012-03-31 (×3): 1 via ORAL
  Filled 2012-03-28 (×2): qty 1

## 2012-03-28 MED ORDER — IBUPROFEN 600 MG PO TABS: 600.0000 mg | ORAL_TABLET | Freq: Four times a day (QID) | ORAL | Status: DC | PRN

## 2012-03-28 MED ORDER — MORPHINE SULFATE 0.5 MG/ML IJ SOLN
INTRAMUSCULAR | Status: AC
  Filled 2012-03-28: qty 10

## 2012-03-28 MED ORDER — MENTHOL 3 MG MT LOZG: 1.0000 | LOZENGE | OROMUCOSAL | Status: DC | PRN

## 2012-03-28 MED ORDER — SCOPOLAMINE 1 MG/3DAYS TD PT72
1.0000 | MEDICATED_PATCH | Freq: Once | TRANSDERMAL | Status: DC
  Administered 2012-03-28: 1.5 mg via TRANSDERMAL

## 2012-03-28 MED ORDER — FENTANYL CITRATE 0.05 MG/ML IJ SOLN
INTRAMUSCULAR | Status: AC
  Filled 2012-03-28: qty 2

## 2012-03-28 MED ORDER — MEPERIDINE HCL 25 MG/ML IJ SOLN: 6.2500 mg | INTRAMUSCULAR | Status: DC | PRN

## 2012-03-28 MED ORDER — SIMETHICONE 80 MG PO CHEW
80.0000 mg | CHEWABLE_TABLET | Freq: Three times a day (TID) | ORAL | Status: DC
  Administered 2012-03-28 – 2012-03-31 (×11): 80 mg via ORAL

## 2012-03-28 MED ORDER — OXYTOCIN 10 UNIT/ML IJ SOLN
40.0000 [IU] | INTRAVENOUS | Status: DC | PRN
  Administered 2012-03-28: 40 [IU] via INTRAVENOUS

## 2012-03-28 MED ORDER — DIPHENHYDRAMINE HCL 50 MG/ML IJ SOLN: 25.0000 mg | INTRAMUSCULAR | Status: DC | PRN

## 2012-03-28 MED ORDER — SODIUM BICARBONATE 8.4 % IV SOLN
INTRAVENOUS | Status: DC | PRN
  Administered 2012-03-28: 5 mL via EPIDURAL

## 2012-03-28 MED ORDER — MEPERIDINE HCL 25 MG/ML IJ SOLN
INTRAMUSCULAR | Status: DC | PRN
  Administered 2012-03-28: 25 mg via INTRAVENOUS

## 2012-03-28 MED ORDER — DIPHENHYDRAMINE HCL 25 MG PO CAPS: 25.0000 mg | ORAL_CAPSULE | Freq: Four times a day (QID) | ORAL | Status: DC | PRN

## 2012-03-28 MED ORDER — DIBUCAINE 1 % RE OINT: 1.0000 "application " | TOPICAL_OINTMENT | RECTAL | Status: DC | PRN

## 2012-03-28 MED ORDER — DIPHENHYDRAMINE HCL 50 MG/ML IJ SOLN: 12.5000 mg | INTRAMUSCULAR | Status: DC | PRN

## 2012-03-28 MED ORDER — KETOROLAC TROMETHAMINE 30 MG/ML IJ SOLN
INTRAMUSCULAR | Status: AC
  Administered 2012-03-28: 30 mg via INTRAMUSCULAR
  Filled 2012-03-28: qty 1

## 2012-03-28 MED ORDER — OXYTOCIN 40 UNITS IN LACTATED RINGERS INFUSION - SIMPLE MED: 62.5000 mL/h | INTRAVENOUS | Status: AC

## 2012-03-28 MED ORDER — OXYTOCIN 10 UNIT/ML IJ SOLN
INTRAMUSCULAR | Status: AC
  Filled 2012-03-28: qty 4

## 2012-03-28 MED ORDER — SODIUM BICARBONATE 8.4 % IV SOLN
INTRAVENOUS | Status: AC
  Filled 2012-03-28: qty 50

## 2012-03-28 MED ORDER — KETOROLAC TROMETHAMINE 30 MG/ML IJ SOLN: 30.0000 mg | Freq: Four times a day (QID) | INTRAMUSCULAR | Status: DC | PRN

## 2012-03-28 MED ORDER — ONDANSETRON HCL 4 MG/2ML IJ SOLN
4.0000 mg | INTRAMUSCULAR | Status: DC | PRN
  Administered 2012-03-28: 4 mg via INTRAVENOUS

## 2012-03-28 MED ORDER — MEPERIDINE HCL 25 MG/ML IJ SOLN
INTRAMUSCULAR | Status: AC
  Filled 2012-03-28: qty 1

## 2012-03-28 MED ORDER — SENNOSIDES-DOCUSATE SODIUM 8.6-50 MG PO TABS
2.0000 | ORAL_TABLET | Freq: Every day | ORAL | Status: DC
  Administered 2012-03-28 – 2012-03-30 (×3): 2 via ORAL

## 2012-03-28 MED ORDER — NALBUPHINE HCL 10 MG/ML IJ SOLN
5.0000 mg | INTRAMUSCULAR | Status: DC | PRN
  Filled 2012-03-28 (×2): qty 1

## 2012-03-28 MED ORDER — SIMETHICONE 80 MG PO CHEW: 80.0000 mg | CHEWABLE_TABLET | ORAL | Status: DC | PRN

## 2012-03-28 MED ORDER — LACTATED RINGERS IV SOLN
INTRAVENOUS | Status: DC
  Administered 2012-03-28: 20:00:00 via INTRAVENOUS

## 2012-03-28 MED ORDER — PHENYLEPHRINE HCL 10 MG/ML IJ SOLN
INTRAMUSCULAR | Status: DC | PRN
  Administered 2012-03-28: 40 ug via INTRAVENOUS

## 2012-03-28 MED ORDER — FENTANYL CITRATE 0.05 MG/ML IJ SOLN
INTRAMUSCULAR | Status: DC | PRN
  Administered 2012-03-28 (×2): 50 ug via INTRAVENOUS

## 2012-03-28 MED ORDER — METOCLOPRAMIDE HCL 5 MG/ML IJ SOLN: 10.0000 mg | Freq: Three times a day (TID) | INTRAMUSCULAR | Status: DC | PRN

## 2012-03-28 SURGICAL SUPPLY — 32 items
ADH SKN CLS APL DERMABOND .7 (GAUZE/BANDAGES/DRESSINGS) ×1
CLOTH BEACON ORANGE TIMEOUT ST (SAFETY) ×2 IMPLANT
DERMABOND ADVANCED (GAUZE/BANDAGES/DRESSINGS) ×1
DERMABOND ADVANCED .7 DNX12 (GAUZE/BANDAGES/DRESSINGS) ×1 IMPLANT
DRAPE LG THREE QUARTER DISP (DRAPES) ×2 IMPLANT
DRSG OPSITE POSTOP 4X10 (GAUZE/BANDAGES/DRESSINGS) ×2 IMPLANT
DURAPREP 26ML APPLICATOR (WOUND CARE) ×2 IMPLANT
ELECT REM PT RETURN 9FT ADLT (ELECTROSURGICAL) ×2
ELECTRODE REM PT RTRN 9FT ADLT (ELECTROSURGICAL) ×1 IMPLANT
EXTRACTOR VACUUM M CUP 4 TUBE (SUCTIONS) IMPLANT
GLOVE BIO SURGEON STRL SZ8.5 (GLOVE) ×4 IMPLANT
GOWN PREVENTION PLUS LG XLONG (DISPOSABLE) ×4 IMPLANT
GOWN PREVENTION PLUS XXLARGE (GOWN DISPOSABLE) ×2 IMPLANT
KIT ABG SYR 3ML LUER SLIP (SYRINGE) IMPLANT
NDL HYPO 25X5/8 SAFETYGLIDE (NEEDLE) ×1 IMPLANT
NEEDLE HYPO 25X5/8 SAFETYGLIDE (NEEDLE) ×2 IMPLANT
NS IRRIG 1000ML POUR BTL (IV SOLUTION) ×2 IMPLANT
PACK C SECTION WH (CUSTOM PROCEDURE TRAY) ×2 IMPLANT
PAD OB MATERNITY 4.3X12.25 (PERSONAL CARE ITEMS) ×2 IMPLANT
SLEEVE SCD COMPRESS KNEE MED (MISCELLANEOUS) IMPLANT
SUT CHROMIC 0 CT 802H (SUTURE) ×2 IMPLANT
SUT CHROMIC 1 CTX 36 (SUTURE) ×4 IMPLANT
SUT CHROMIC 2 0 SH (SUTURE) ×2 IMPLANT
SUT GUT PLAIN 0 CT-3 TAN 27 (SUTURE) IMPLANT
SUT MON AB 4-0 PS1 27 (SUTURE) ×2 IMPLANT
SUT VIC AB 0 CT1 18XCR BRD8 (SUTURE) IMPLANT
SUT VIC AB 0 CT1 8-18 (SUTURE)
SUT VIC AB 0 CTX 36 (SUTURE) ×4
SUT VIC AB 0 CTX36XBRD ANBCTRL (SUTURE) ×2 IMPLANT
TOWEL OR 17X24 6PK STRL BLUE (TOWEL DISPOSABLE) ×6 IMPLANT
TRAY FOLEY CATH 14FR (SET/KITS/TRAYS/PACK) ×2 IMPLANT
WATER STERILE IRR 1000ML POUR (IV SOLUTION) ×2 IMPLANT

## 2012-03-28 NOTE — Anesthesia Postprocedure Evaluation (Signed)
  Anesthesia Post-op Note  Patient: [name]  Procedure(s) Performed: Procedure(s) (LRB) with comments: CESAREAN SECTION (N/A) - Primary Cesarean Section Delivery Baby Boy @ [phone], Apgars 8/8  Patient Location: Mother/Baby  Anesthesia Type:Epidural  Level of Consciousness: awake  Airway and Oxygen Therapy: Patient Spontanous Breathing  Post-op Pain: none  Post-op Assessment: Patient's Cardiovascular Status Stable, Respiratory Function Stable, Patent Airway, No signs of Nausea or vomiting, Adequate PO intake, Pain level controlled, No headache, No backache, No residual numbness and No residual motor weakness  Post-op Vital Signs: Reviewed and stable  Complications: No apparent anesthesia complications

## 2012-03-28 NOTE — Anesthesia Postprocedure Evaluation (Signed)
  Anesthesia Post-op Note  Patient: [name]  Procedure(s) Performed: Procedure(s) (LRB) with comments: CESAREAN SECTION (N/A) - Primary Cesarean Section Delivery Baby Boy @ [phone], Apgars 8/8  Patient Location: PACU  Anesthesia Type:Epidural  Level of Consciousness: awake, alert , oriented and patient cooperative  Airway and Oxygen Therapy: Patient Spontanous Breathing  Post-op Pain: mild  Post-op Assessment: Post-op Vital signs reviewed and Patient's Cardiovascular Status Stable  Post-op Vital Signs: Reviewed and stable  Complications: No apparent anesthesia complications

## 2012-03-28 NOTE — Addendum Note (Signed)
Addendum  created 03/28/12 1431 by Suella Grove, CRNA   Modules edited:Notes Section

## 2012-03-28 NOTE — Op Note (Signed)
preop diagnosis failure to progress in labor Postop diagnosis the same Surgeon Dr. Francoise Ceo Anesthesia epidural Procedure patient placed on the operating table in the supine position abdomen prepped and draped bladder emptied with a Foley catheter a transverse suprapubic incision made carried down to the rectus fascia the fascia cleaned and excised the length of the incision the recti muscles retracted laterally peritoneum incised longitudinally transverse incision made in the visceroperitoneum above the bladder bladder mobilized inferiorly a transverse low uterine incision made patient delivered of a female Apgar 8 and 8 from the OP position the placenta was fundal removed manually and sent to labor and delivery the uterine cavity clean with dry laps the uterine incision closed with continuous suture of number oh 1 chromic hemostasis was satisfactory bladder flap reattached with 2-0 chromic tubes and ovaries normal abdomen closed in layers peritoneum continuous with 2-0 chromic fascia continuous within of 0 Dexon and the skin closed with subcuticular stitch of 4-0 Monocryl blood loss   600 cc patient tolerated the procedure well

## 2012-03-28 NOTE — Anesthesia Postprocedure Evaluation (Signed)
  Anesthesia Post-op Note  Patient: [name]  Procedure(s) Performed: Procedure(s) (LRB) with comments: CESAREAN SECTION (N/A) - Primary Cesarean Section Delivery Baby Boy @ [phone], Apgars 8/8  Patient Location: PACU  Anesthesia Type:Epidural  Level of Consciousness: awake, alert  and oriented  Airway and Oxygen Therapy: Patient Spontanous Breathing  Post-op Pain: none  Post-op Assessment: Post-op Vital signs reviewed, Patient's Cardiovascular Status Stable, Respiratory Function Stable, Patent Airway, No signs of Nausea or vomiting, Pain level controlled, No headache, No backache and No residual numbness  Post-op Vital Signs: Reviewed and stable  Complications: No apparent anesthesia complications

## 2012-03-28 NOTE — Anesthesia Postprocedure Evaluation (Signed)
  Anesthesia Post-op Note  Patient: [name]  Procedure(s) Performed: Procedure(s) (LRB) with comments: CESAREAN SECTION (N/A) - Primary Cesarean Section Delivery Baby Boy @ [phone], Apgars 8/8  Patient Location: PACU and Mother/Baby  Anesthesia Type:Epidural  Level of Consciousness: awake, alert , oriented and patient cooperative  Airway and Oxygen Therapy: Patient Spontanous Breathing  Post-op Pain: mild  Post-op Assessment: Patient's Cardiovascular Status Stable, Respiratory Function Stable, Patent Airway, No signs of Nausea or vomiting, Adequate PO intake and Pain level controlled  Post-op Vital Signs: stable  Complications: No apparent anesthesia complications

## 2012-03-28 NOTE — Progress Notes (Signed)
Patient ID: [name], female   DOB: 08-Jan-1986, 27 y.o.   MRN: [id number] At  11pml p.m. The patient was 8 cm and -1 station she's been in good labor and the cervix has not changed she still 8 cm and -1 so she lip of by C-section 4 fitted to progress in labor

## 2012-03-28 NOTE — Transfer of Care (Signed)
Immediate Anesthesia Transfer of Care Note  Patient: [name]  Procedure(s) Performed: Procedure(s) (LRB) with comments: CESAREAN SECTION (N/A) - Primary Cesarean Section Delivery Baby Boy @ [phone], Apgars 8/8  Patient Location: PACU  Anesthesia Type:Epidural  Level of Consciousness: awake, alert , oriented and patient cooperative  Airway & Oxygen Therapy: Patient Spontanous Breathing  Post-op Assessment: Report given to PACU RN, Post -op Vital signs reviewed and stable and Patient moving all extremities X 4  Post vital signs: Reviewed and stable  Complications: No apparent complication

## 2012-03-29 ENCOUNTER — Encounter (HOSPITAL_COMMUNITY): Payer: Self-pay | Admitting: Obstetrics

## 2012-03-29 LAB — CBC
HCT: 25.1 % — ABNORMAL LOW (ref 36.0–46.0)
MCH: 24.5 pg — ABNORMAL LOW (ref 26.0–34.0)
MCHC: 32.7 g/dL (ref 30.0–36.0)
MCV: 74.9 fL — ABNORMAL LOW (ref 78.0–100.0)
Platelets: 277 10*3/uL (ref 150–400)
RDW: 15.6 % — ABNORMAL HIGH (ref 11.5–15.5)

## 2012-03-29 NOTE — Progress Notes (Signed)
Ur chart review completed.  

## 2012-03-29 NOTE — Progress Notes (Signed)
Patient ID: [name], female   DOB: [birth year], 26 y.o.   MRN: [id number] Postop day 1 Vital signs normal Abdomen soft Dressing dry Legs negative No complaints

## 2012-03-30 NOTE — Progress Notes (Signed)
Subjective: Postpartum Day 2: Cesarean Delivery Patient reports tolerating PO, + flatus, + BM and no problems voiding.    Objective: Vital signs in last 24 hours: Temp:  [97.7 F (36.5 C)-98.2 F (36.8 C)] 97.7 F (36.5 C) (01/17 0603) Pulse Rate:  [71-75] 71  (01/17 0603) Resp:  [16-18] 16  (01/17 0603) BP: (109-112)/(69-72) 111/72 mmHg (01/17 0603)  Physical Exam:  General: alert and no distress Lochia: appropriate Uterine Fundus: firm Incision: healing well DVT Evaluation: No evidence of DVT seen on physical exam.   Basename 03/29/12 0520 03/27/12 1024  HGB 8.2* 11.3*  HCT 25.1* 34.6*    Assessment/Plan: Status post Cesarean section. Doing well postoperatively.  Continue current care.  HARPER,CHARLES A 03/30/2012, 9:17 AM

## 2012-03-31 DIAGNOSIS — D62 Acute posthemorrhagic anemia: Secondary | ICD-10-CM | POA: Diagnosis not present

## 2012-03-31 MED ORDER — OXYCODONE-ACETAMINOPHEN 5-325 MG PO TABS: 1.0000 | ORAL_TABLET | Freq: Four times a day (QID) | ORAL | Status: DC | PRN

## 2012-03-31 MED ORDER — PRENATAL MULTIVITAMIN CH: 1.0000 | ORAL_TABLET | Freq: Every day | ORAL | Status: DC

## 2012-03-31 MED ORDER — ETONOGESTREL-ETHINYL ESTRADIOL 0.12-0.015 MG/24HR VA RING: VAGINAL_RING | VAGINAL | Status: DC

## 2012-03-31 MED ORDER — FERROUS SULFATE 325 (65 FE) MG PO TABS: 325.0000 mg | ORAL_TABLET | Freq: Two times a day (BID) | ORAL | Status: DC

## 2012-03-31 NOTE — Discharge Summary (Signed)
  Obstetric Discharge Summary Reason for Admission: onset of labor Prenatal Procedures: none Intrapartum Procedures: cesarean: low cervical, transverse Postpartum Procedures: none Complications-Operative and Postpartum: none  Hemoglobin  Date Value Range Status  03/29/2012 8.2* 12.0 - 15.0 g/dL Final     DELTA CHECK NOTED     REPEATED TO VERIFY     HCT  Date Value Range Status  03/29/2012 25.1* 36.0 - 46.0 % Final    Physical Exam:  General: alert Lochia: appropriate Uterine: firm Incision: dressing C/D DVT Evaluation: No evidence of DVT seen on physical exam.  Discharge Diagnoses: Active Problems:  Arrested labor  Cesarean delivery delivered  Postoperative anemia due to acute blood loss   Discharge Information: Date: 03/31/2012 Activity: pelvic rest Diet: routine Medications:  Prior to Admission medications   Medication Sig Start Date End Date Taking? Authorizing Provider  etonogestrel-ethinyl estradiol (NUVARING) 0.12-0.015 MG/24HR vaginal ring Insert vaginally and leave in place for 3 consecutive weeks, then remove for 1 week. 03/31/12   Antionette Char, MD  ferrous sulfate 325 (65 FE) MG tablet Take 1 tablet (325 mg total) by mouth 2 (two) times daily before a meal. 03/31/12   Antionette Char, MD  oxyCODONE-acetaminophen (PERCOCET/ROXICET) 5-325 MG per tablet Take 1-2 tablets by mouth every 6 (six) hours as needed (moderate - severe pain). 03/31/12   Antionette Char, MD  Prenatal Vit-Fe Fumarate-FA (PRENATAL MULTIVITAMIN) TABS Take 1 tablet by mouth daily. 03/31/12   Antionette Char, MD    Condition: stable Instructions: refer to routine discharge instructions Discharge to: home Follow-up Information    Call MARSHALL,BERNARD A, MD. (Next week)    Contact information:   [address] [place] [address] [phone]          Newborn Data: Live born  Information for the patient's newborn:  Kylah, Maresh [[id number]]   female  ; APGAR (1 MIN): 8   APGAR (5 MINS): 8    Home with mother.  JACKSON-MOORE,Alyson Ki A 03/31/2012, 11:07 AM

## 2013-01-17 ENCOUNTER — Other Ambulatory Visit: Payer: Self-pay

## 2013-12-27 ENCOUNTER — Other Ambulatory Visit: Payer: Self-pay

## 2014-01-13 ENCOUNTER — Encounter (HOSPITAL_COMMUNITY): Payer: Self-pay | Admitting: Obstetrics

## 2016-08-26 ENCOUNTER — Inpatient Hospital Stay (HOSPITAL_COMMUNITY): Payer: Self-pay

## 2016-08-26 ENCOUNTER — Encounter (HOSPITAL_COMMUNITY): Payer: Self-pay | Admitting: *Deleted

## 2016-08-26 ENCOUNTER — Inpatient Hospital Stay (HOSPITAL_COMMUNITY)
Admission: AD | Admit: 2016-08-26 | Discharge: 2016-08-26 | Disposition: A | Discharge status: Home / Self Care | Payer: Self-pay | Source: Ambulatory Visit | Attending: Obstetrics and Gynecology | Admitting: Obstetrics and Gynecology

## 2016-08-26 DIAGNOSIS — Z833 Family history of diabetes mellitus: Secondary | ICD-10-CM | POA: Insufficient documentation

## 2016-08-26 DIAGNOSIS — Z8249 Family history of ischemic heart disease and other diseases of the circulatory system: Secondary | ICD-10-CM | POA: Insufficient documentation

## 2016-08-26 DIAGNOSIS — Z8261 Family history of arthritis: Secondary | ICD-10-CM | POA: Insufficient documentation

## 2016-08-26 DIAGNOSIS — Z818 Family history of other mental and behavioral disorders: Secondary | ICD-10-CM | POA: Insufficient documentation

## 2016-08-26 DIAGNOSIS — O209 Hemorrhage in early pregnancy, unspecified: Secondary | ICD-10-CM

## 2016-08-26 DIAGNOSIS — Z9889 Other specified postprocedural states: Secondary | ICD-10-CM | POA: Insufficient documentation

## 2016-08-26 DIAGNOSIS — Z79899 Other long term (current) drug therapy: Secondary | ICD-10-CM | POA: Insufficient documentation

## 2016-08-26 DIAGNOSIS — R1030 Lower abdominal pain, unspecified: Secondary | ICD-10-CM | POA: Insufficient documentation

## 2016-08-26 DIAGNOSIS — O00109 Unspecified tubal pregnancy without intrauterine pregnancy: Secondary | ICD-10-CM | POA: Insufficient documentation

## 2016-08-26 DIAGNOSIS — Z3A08 8 weeks gestation of pregnancy: Secondary | ICD-10-CM | POA: Insufficient documentation

## 2016-08-26 DIAGNOSIS — Z809 Family history of malignant neoplasm, unspecified: Secondary | ICD-10-CM | POA: Insufficient documentation

## 2016-08-26 DIAGNOSIS — O00101 Right tubal pregnancy without intrauterine pregnancy: Secondary | ICD-10-CM

## 2016-08-26 LAB — CBC
HCT: 36 % (ref 36.0–46.0)
Hemoglobin: 11.7 g/dL — ABNORMAL LOW (ref 12.0–15.0)
MCH: 25.2 pg — ABNORMAL LOW (ref 26.0–34.0)
MCHC: 32.5 g/dL (ref 30.0–36.0)
MCV: 77.4 fL — ABNORMAL LOW (ref 78.0–100.0)
PLATELETS: 420 10*3/uL — AB (ref 150–400)
RBC: 4.65 MIL/uL (ref 3.87–5.11)
RDW: 15.2 % (ref 11.5–15.5)
WBC: 9.1 10*3/uL (ref 4.0–10.5)

## 2016-08-26 LAB — URINALYSIS, ROUTINE W REFLEX MICROSCOPIC
BILIRUBIN URINE: NEGATIVE
Glucose, UA: NEGATIVE mg/dL
KETONES UR: NEGATIVE mg/dL
Nitrite: NEGATIVE
Protein, ur: NEGATIVE mg/dL
Specific Gravity, Urine: 1.027 (ref 1.005–1.030)
pH: 5 (ref 5.0–8.0)

## 2016-08-26 LAB — CREATININE, SERUM
CREATININE: 0.77 mg/dL (ref 0.44–1.00)
GFR calc Af Amer: >60 mL/min (ref >60)

## 2016-08-26 LAB — BUN: BUN: 11 mg/dL (ref 6–20)

## 2016-08-26 LAB — AST: AST: 20 U/L (ref 15–41)

## 2016-08-26 LAB — HCG, QUANTITATIVE, PREGNANCY: HCG, BETA CHAIN, QUANT, S: 46 m[IU]/mL — AB (ref <5)

## 2016-08-26 LAB — POCT PREGNANCY, URINE: PREG TEST UR: POSITIVE — AB

## 2016-08-26 MED ORDER — METHOTREXATE INJECTION FOR WOMEN'S HOSPITAL
50.0000 mg/m2 | Freq: Once | INTRAMUSCULAR | Status: AC
  Administered 2016-08-26: 95 mg via INTRAMUSCULAR
  Filled 2016-08-26: qty 1.9

## 2016-08-26 NOTE — Progress Notes (Addendum)
G1 @ ??. States her last period was April 16 but started bleeding non stop since May30.   1810: [employer] tech paged. Returned page received and made aware pt needs service.   1907: relinquished care over to RN [place]

## 2016-08-26 NOTE — MAU Note (Signed)
Found out that she was preg, has been bleeding since 5/30.   When started bleeding, her cycle was 2 wks late.was heavier.

## 2016-08-26 NOTE — Discharge Instructions (Signed)
Ectopic Pregnancy An ectopic pregnancy is when the fertilized egg attaches (implants) outside the uterus. Most ectopic pregnancies occur in one of the tubes where eggs travel from the ovary to the uterus (fallopian tubes), but the implanting can occur in other locations. In rare cases, ectopic pregnancies occur on the ovary, intestine, pelvis, abdomen, or cervix. In an ectopic pregnancy, the fertilized egg does not have the ability to develop into a normal, healthy baby. A ruptured ectopic pregnancy is one in which tearing or bursting of a fallopian tube causes internal bleeding. Often, there is intense lower abdominal pain, and vaginal bleeding sometimes occurs. Having an ectopic pregnancy can be life-threatening. If this dangerous condition is not treated, it can lead to blood loss, shock, or even death. What are the causes? The most common cause of this condition is damage to one of the fallopian tubes. A fallopian tube may be narrowed or blocked, and that keeps the fertilized egg from reaching the uterus. What increases the risk? This condition is more likely to develop in women of childbearing age who have different levels of risk. The levels of risk can be divided into three categories. High risk  You have gone through infertility treatment.  You have had an ectopic pregnancy before.  You have had surgery on the fallopian tubes, or another surgical procedure, such as an abortion.  You have had surgery to have the fallopian tubes tied (tubal ligation).  You have problems or diseases of the fallopian tubes.  You have been exposed to diethylstilbestrol (DES). This medicine was used until 1971, and it had effects on babies whose mothers took the medicine.  You become pregnant while using an IUD (intrauterine device) for birth control. Moderate risk  You have a history of infertility.  You have had an STI (sexually transmitted infection).  You have a history of pelvic inflammatory  disease (PID).  You have scarring from endometriosis.  You have multiple sexual partners.  You smoke. Low risk  You have had pelvic surgery.  You use vaginal douches.  You became sexually active before age 18. What are the signs or symptoms? Common symptoms of this condition include normal pregnancy symptoms, such as missing a period, nausea, tiredness, abdominal pain, breast tenderness, and bleeding. However, ectopic pregnancy will have additional symptoms, such as:  Pain with intercourse.  Irregular vaginal bleeding or spotting.  Cramping or pain on one side or in the lower abdomen.  Fast heartbeat, low blood pressure, and sweating.  Passing out while having a bowel movement.  Symptoms of a ruptured ectopic pregnancy and internal bleeding may include:  Sudden, severe pain in the abdomen and pelvis.  Dizziness, weakness, light-headedness, or fainting.  Pain in the shoulder or neck area.  How is this diagnosed? This condition is diagnosed by:  A pelvic exam to locate pain or a mass in the abdomen.  A pregnancy test. This blood test checks for the presence as well as the specific level of pregnancy hormone in the bloodstream.  Ultrasound. This is performed if a pregnancy test is positive. In this test, a probe is inserted into the vagina. The probe will detect a fetus, possibly in a location other than the uterus.  Taking a sample of uterus tissue (dilation and curettage, or D&C).  Surgery to perform a visual exam of the inside of the abdomen using a thin, lighted tube that has a tiny camera on the end (laparoscope).  Culdocentesis. This procedure involves inserting a needle at the top   of the vagina, behind the uterus. If blood is present in this area, it may indicate that a fallopian tube is torn.  How is this treated? This condition is treated with medicine or surgery. Medicine  An injection of a medicine (methotrexate) may be given to cause the pregnancy tissue  to be absorbed. This medicine may save your fallopian tube. It may be given if: ? The diagnosis is made early, with no signs of active bleeding. ? The fallopian tube has not ruptured. ? You are considered to be a good candidate for the medicine. Usually, pregnancy hormone blood levels are checked after methotrexate treatment. This is to be sure that the medicine is effective. It may take 4-6 weeks for the pregnancy to be absorbed. Most pregnancies will be absorbed by 3 weeks. Surgery  A laparoscope may be used to remove the pregnancy tissue.  If severe internal bleeding occurs, a larger cut (incision) may be made in the lower abdomen (laparotomy) to remove the fetus and placenta. This is done to stop the bleeding.  Part or all of the fallopian tube may be removed (salpingectomy) along with the fetus and placenta. The fallopian tube may also be repaired during the surgery.  In very rare circumstances, removal of the uterus (hysterectomy) may be required.  After surgery, pregnancy hormone testing may be done to be sure that there is no pregnancy tissue left. Whether your treatment is medicine or surgery, you may receive a Rho (D) immune globulin shot to prevent problems with any future pregnancy. This shot may be given if:  You are Rh-negative and the baby's father is Rh-positive.  You are Rh-negative and you do not know the Rh type of the baby's father.  Follow these instructions at home:  Rest and limit your activity after the procedure for as long as told by your health care provider.  Until your health care provider says that it is safe: ? Do not lift anything that is heavier than 10 lb (4.5 kg), or the limit that your health care provider tells you. ? Avoid physical exercise and any movement that requires effort (is strenuous).  To help prevent constipation: ? Eat a healthy diet that includes fruits, vegetables, and whole grains. ? Drink 6-8 glasses of water per day. Get help  right away if:  You develop worsening pain that is not relieved by medicine.  You have: ? A fever or chills. ? Vaginal bleeding. ? Redness and swelling at the incision site. ? Nausea and vomiting.  You feel dizzy or weak.  You feel light-headed or you faint. This information is not intended to replace advice given to you by your health care provider. Make sure you discuss any questions you have with your health care provider. Document Released: 04/07/2004 Document Revised: 10/28/2015 Document Reviewed: 09/30/2015 Elsevier Interactive Patient Education  2018 Elsevier Inc.  

## 2016-08-26 NOTE — MAU Provider Note (Signed)
History     CSN: [id number]  Arrival date and time: 08/26/16 1635  First Provider Initiated Contact with Patient 08/26/16 1820   Chief Complaint  Patient presents with  . Vaginal Bleeding  . Abdominal Cramping  . Possible Pregnancy   HPI [name] is a 31 y.o. G3P1011 at [redacted]w[redacted]d by LMP who presents with vaginal bleeding & abdominal cramping. Patient reports daily bleeding like a period since 5/30. For the last few days the bleeding has decreased to pink spotting. Was at [facility] today for possible STD exposure & had a positive pregnancy test. States they performed pelvic exam & collected swabs. Also reports intermittent lower abdominal cramping. Rates pain 5/10. Has not treated. Denies n/v/d, constipation, dysuria, or vaginal discharge.   OB History    Gravida Para Term Preterm AB Living   3 1 1   1 1    SAB TAB Ectopic Multiple Live Births     1     1      Past Medical History:  Diagnosis Date  . Blood transfusion    can't remember dates but NOT with last pregnancy  . Menorrhagia 2007   blood transfusion    Past Surgical History:  Procedure Laterality Date  . CESAREAN SECTION  03/28/2012   Procedure: CESAREAN SECTION;  Surgeon: Kathreen Cosier, MD;  Location: [facility];  Service: Obstetrics;  Laterality: N/A;  Primary Cesarean Section Delivery Baby Boy @ 0543, Apgars 8/8  . INDUCED ABORTION    . WISDOM TOOTH EXTRACTION  2013    Family History  Problem Relation Age of Onset  . Arthritis Father   . Diabetes Father   . Hypertension Father   . Heart disease Father   . Arthritis Maternal Grandmother   . Depression Maternal Grandmother   . Hypertension Maternal Grandmother   . Heart disease Maternal Grandmother   . Depression Maternal Grandfather   . Arthritis Paternal Grandmother   . Cancer Paternal Grandmother   . Depression Paternal Grandmother   . Depression Paternal Grandfather   . Other Neg Hx     Social History  Substance Use Topics  . Smoking status:  Never Smoker  . Smokeless tobacco: Not on file  . Alcohol use No    Allergies: No Known Allergies  Prescriptions Prior to Admission  Medication Sig Dispense Refill Last Dose  . etonogestrel-ethinyl estradiol (NUVARING) 0.12-0.015 MG/24HR vaginal ring Insert vaginally and leave in place for 3 consecutive weeks, then remove for 1 week. 1 each 12   . ferrous sulfate 325 (65 FE) MG tablet Take 1 tablet (325 mg total) by mouth 2 (two) times daily before a meal. 60 tablet 11   . oxyCODONE-acetaminophen (PERCOCET/ROXICET) 5-325 MG per tablet Take 1-2 tablets by mouth every 6 (six) hours as needed (moderate - severe pain). 30 tablet 0   . Prenatal Vit-Fe Fumarate-FA (PRENATAL MULTIVITAMIN) TABS Take 1 tablet by mouth daily. 60 tablet 5     Review of Systems  Constitutional: Negative.   Gastrointestinal: Positive for abdominal pain. Negative for constipation, diarrhea, nausea and vomiting.  Genitourinary: Positive for vaginal bleeding. Negative for vaginal discharge.   Physical Exam   Blood pressure 124/61, pulse 68, temperature 98.2 F (36.8 C), temperature source Oral, resp. rate 18, weight 179 lb 12 oz (81.5 kg), last menstrual period 06/27/2016, SpO2 100 %, unknown if currently breastfeeding.  Physical Exam  Nursing note and vitals reviewed. Constitutional: She is oriented to person, place, and time. She appears well-developed and well-nourished.  No distress.  HENT:  Head: Normocephalic and atraumatic.  Eyes: Conjunctivae are normal. Right eye exhibits no discharge. Left eye exhibits no discharge. No scleral icterus.  Neck: Normal range of motion.  Respiratory: Effort normal. No respiratory distress.  GI: Soft. There is no tenderness.  Neurological: She is alert and oriented to person, place, and time.  Skin: Skin is warm and dry. She is not diaphoretic.  Psychiatric: She has a normal mood and affect. Her behavior is normal. Judgment and thought content normal.    MAU Course   Procedures Results for orders placed or performed during the hospital encounter of 08/26/16 (from the past 24 hour(s))  Urinalysis, Routine w reflex microscopic     Status: Abnormal   Collection Time: 08/26/16  5:05 PM  Result Value Ref Range   Color, Urine YELLOW YELLOW   APPearance CLOUDY (A) CLEAR   Specific Gravity, Urine 1.027 1.005 - 1.030   pH 5.0 5.0 - 8.0   Glucose, UA NEGATIVE NEGATIVE mg/dL   Hgb urine dipstick SMALL (A) NEGATIVE   Bilirubin Urine NEGATIVE NEGATIVE   Ketones, ur NEGATIVE NEGATIVE mg/dL   Protein, ur NEGATIVE NEGATIVE mg/dL   Nitrite NEGATIVE NEGATIVE   Leukocytes, UA LARGE (A) NEGATIVE   RBC / HPF 6-30 0 - 5 RBC/hpf   WBC, UA TOO NUMEROUS TO COUNT 0 - 5 WBC/hpf   Bacteria, UA RARE (A) NONE SEEN   Squamous Epithelial / LPF 6-30 (A) NONE SEEN   Mucous PRESENT   Pregnancy, urine POC     Status: Abnormal   Collection Time: 08/26/16  5:12 PM  Result Value Ref Range   Preg Test, Ur POSITIVE (A) NEGATIVE  CBC     Status: Abnormal   Collection Time: 08/26/16  5:31 PM  Result Value Ref Range   WBC 9.1 4.0 - 10.5 K/uL   RBC 4.65 3.87 - 5.11 MIL/uL   Hemoglobin 11.7 (L) 12.0 - 15.0 g/dL   HCT 16.1 09.6 - 04.5 %   MCV 77.4 (L) 78.0 - 100.0 fL   MCH 25.2 (L) 26.0 - 34.0 pg   MCHC 32.5 30.0 - 36.0 g/dL   RDW 40.9 81.1 - 91.4 %   Platelets 420 (H) 150 - 400 K/uL  hCG, quantitative, pregnancy     Status: Abnormal   Collection Time: 08/26/16  5:31 PM  Result Value Ref Range   hCG, Beta Chain, Quant, S 46 (H) <5 mIU/mL   US Ob Comp Less 14 Wks  Result Date: 08/26/2016 CLINICAL DATA:  Bleeding with cramping EXAM: OBSTETRIC <14 WK [place] AND TRANSVAGINAL OB US TECHNIQUE: Both transabdominal and transvaginal ultrasound examinations were performed for complete evaluation of the gestation as well as the maternal uterus, adnexal regions, and pelvic cul-de-sac. Transvaginal technique was performed to assess early pregnancy. COMPARISON:  None. FINDINGS: Intrauterine  gestational sac: Not visualized Yolk sac:  Not visualized Embryo:  Not visualized Maternal uterus/adnexae: Probable small fibroid in the uterine fundus measuring 0.7 x 0.7 x 0.7 cm. The ovaries are within normal limits. The left ovary measures 2 x 1.9 x 1.8 cm. The right ovary measures 2.4 x 1.6 x 2.3 cm. Within the right at adnexa, is a small soft tissue echogenicity with central fluid, this measures 1.1 x 1 x 1.3 cm and appears separate from the right ovary. No free fluid. IMPRESSION: 1. No intrauterine pregnancy is visualized 2. Right adnexal soft tissue echogenicity measuring 1.3 cm with small central fluid, separate from the right ovary ;  could be consistent with small ectopic pregnancy. 3. Probable small fundal fibroid measuring 7 mm Critical Value/emergent results were called by telephone at the time of interpretation on 08/26/2016 at 9:07 pm to Dr. Judeth HornERIN LAWRENCE , who verbally acknowledged these results. Electronically Signed   By: Jasmine PangKim  Fujinaga M.D.   On: 08/26/2016 21:07   [place] Ob Transvaginal  Result Date: 08/26/2016 CLINICAL DATA:  Bleeding with cramping EXAM: OBSTETRIC <14 WK US AND TRANSVAGINAL OB US TECHNIQUE: Both transabdominal and transvaginal ultrasound examinations were performed for complete evaluation of the gestation as well as the maternal uterus, adnexal regions, and pelvic cul-de-sac. Transvaginal technique was performed to assess early pregnancy. COMPARISON:  None. FINDINGS: Intrauterine gestational sac: Not visualized Yolk sac:  Not visualized Embryo:  Not visualized Maternal uterus/adnexae: Probable small fibroid in the uterine fundus measuring 0.7 x 0.7 x 0.7 cm. The ovaries are within normal limits. The left ovary measures 2 x 1.9 x 1.8 cm. The right ovary measures 2.4 x 1.6 x 2.3 cm. Within the right at adnexa, is a small soft tissue echogenicity with central fluid, this measures 1.1 x 1 x 1.3 cm and appears separate from the right ovary. No free fluid. IMPRESSION: 1. No intrauterine  pregnancy is visualized 2. Right adnexal soft tissue echogenicity measuring 1.3 cm with small central fluid, separate from the right ovary ; could be consistent with small ectopic pregnancy. 3. Probable small fundal fibroid measuring 7 mm Critical Value/emergent results were called by telephone at the time of interpretation on 08/26/2016 at 9:07 pm to Dr. Judeth HornERIN LAWRENCE , who verbally acknowledged these results. Electronically Signed   By: Jasmine PangKim  Fujinaga M.D.   On: 08/26/2016 21:07     MDM +UPT UA, CBC, ABO/Rh, quant hCG, HIV, and US today to rule out ectopic pregnancy AB positive BHCG 46 Ultrasound; no IUP, right adnexal mass separate from ovary suspicious for ectopic pregnancy. No free fluid.  Discussed results with Dr. Jolayne Pantheronstant. Will offer patient methotrexate.   The risks of methotrexate were reviewed including failure requiring repeat dosing or eventual surgery. She understands that methotrexate involves frequent return visits to monitor lab values and that she remains at risk of ectopic rupture until her beta is less than assay. ?The patient opts to proceed with methotrexate.  She has no history of hepatic or renal dysfunction, has normal BUN/Cr/LFT's/platelets.  She is felt to be reliable for follow-up. Side effects of photosensitivity & GI upset were discussed.  She knows to avoid direct sunlight and abstain from alcohol, aspirin and aspirin-like products for two weeks. She was counseled to discontinue any MVI with folic acid. ?She understands to follow up on D4 (Monday) and D7 (Thursday) for repeat BHCG and was given the instruction sheet. Strict ectopic precautions were reviewed, the patient knows to call with any abdominal pain, vomiting, fainting, or any concerns with her health.  Rh+, no Rhogam necessary. Assessment and Plan  A: 1. Right tubal pregnancy without intrauterine pregnancy   2. Vaginal bleeding in pregnancy, first trimester    P: Discharge home in stable  condition Discussed reasons to return to MAU for worsening pain, vaginal bleeding, etc Discussed meds & foods to avoid Return to MAU Monday after work for day 4 BHCG  Judeth Hornrin Lawrence 08/26/2016, 6:20 PM   Pt was stable 30 minutes after MTX given, ectopic precautions/reasons to return to MAU reviewed.  Sharen CounterLisa Leftwich-Kirby, CNM 10:35 PM

## 2016-08-29 ENCOUNTER — Inpatient Hospital Stay (HOSPITAL_COMMUNITY)
Admission: AD | Admit: 2016-08-29 | Discharge: 2016-08-29 | Disposition: A | Discharge status: Home / Self Care | Payer: Self-pay | Source: Ambulatory Visit | Attending: Obstetrics and Gynecology | Admitting: Obstetrics and Gynecology

## 2016-08-29 DIAGNOSIS — O009 Unspecified ectopic pregnancy without intrauterine pregnancy: Secondary | ICD-10-CM | POA: Insufficient documentation

## 2016-08-29 DIAGNOSIS — O00201 Right ovarian pregnancy without intrauterine pregnancy: Secondary | ICD-10-CM

## 2016-08-29 LAB — HCG, QUANTITATIVE, PREGNANCY: HCG, BETA CHAIN, QUANT, S: 26 m[IU]/mL — AB (ref <5)

## 2016-08-29 NOTE — MAU Provider Note (Signed)
  History     CSN: [id number]  Arrival date and time: 08/29/16 1620   None     No chief complaint on file.  31 year old G3 P1 at 9 weeks 0 days by LMP presents for day for methotrexate beta hCG check. Patient had a beta hCG of 46 with a suspicious mass near to but separate from the ovary. She continues to report mild pain controlled with ibuprofen and moderate bleeding.    OB History    Gravida Para Term Preterm AB Living   3 1 1   1 1    SAB TAB Ectopic Multiple Live Births     1     1      Past Medical History:  Diagnosis Date  . Blood transfusion    can't remember dates but NOT with last pregnancy  . Menorrhagia 2007   blood transfusion    Past Surgical History:  Procedure Laterality Date  . CESAREAN SECTION  03/28/2012   Procedure: CESAREAN SECTION;  Surgeon: Kathreen CosierBernard A Marshall, MD;  Location: [facility];  Service: Obstetrics;  Laterality: N/A;  Primary Cesarean Section Delivery Baby Boy @ 0543, Apgars 8/8  . INDUCED ABORTION    . WISDOM TOOTH EXTRACTION  2013    Family History  Problem Relation Age of Onset  . Arthritis Father   . Diabetes Father   . Hypertension Father   . Heart disease Father   . Arthritis Maternal Grandmother   . Depression Maternal Grandmother   . Hypertension Maternal Grandmother   . Heart disease Maternal Grandmother   . Depression Maternal Grandfather   . Arthritis Paternal Grandmother   . Cancer Paternal Grandmother   . Depression Paternal Grandmother   . Depression Paternal Grandfather   . Other Neg Hx     Social History  Substance Use Topics  . Smoking status: Never Smoker  . Smokeless tobacco: Not on file  . Alcohol use No    Allergies: No Known Allergies  No prescriptions prior to admission.    Review of Systems  Constitutional: Negative for chills and fever.  Gastrointestinal: Positive for abdominal pain.  Neurological: Negative for dizziness and weakness.   Physical Exam   Blood pressure 118/82, pulse 72,  temperature 98.2 F (36.8 C), temperature source Oral, resp. rate 15, last menstrual period 06/27/2016, SpO2 100 %, unknown if currently breastfeeding.  Physical Exam  Constitutional: She appears well-developed and well-nourished.  GI: Soft. She exhibits no distension. There is no tenderness. There is no rebound and no guarding.    MAU Course  Procedures  MDM In MA U patient had repeat beta hCG which was found to be 26.  Assessment and Plan  Ectopic pregnancy status post methotrexate. Return on day 7 for beta hCG. Patient's pain is stable.  Ernestina Pennaicholas Constant [place] 08/29/2016, 5:36 PM

## 2016-08-29 NOTE — MAU Note (Signed)
Pt here for follow up BHCG , is still having some pain but states ibuprofen relieves it. Bleeding and changing a pad q 4 hours.

## 2016-08-29 NOTE — Discharge Instructions (Signed)
Ectopic Pregnancy °An ectopic pregnancy happens when a fertilized egg grows outside the uterus. A pregnancy cannot live outside of the uterus. This problem often happens in the fallopian tube. It is often caused by damage to the fallopian tube. °If this problem is found early, you may be treated with medicine. If your tube tears or bursts open (ruptures), you will bleed inside. This is an emergency. You will need surgery. Get help right away. °What are the signs or symptoms? °You may have normal pregnancy symptoms at first. These include: °· Missing your period. °· Feeling sick to your stomach (nauseous). °· Being tired. °· Having tender breasts. ° °Then, you may start to have symptoms that are not normal. These include: °· Pain with sex (intercourse). °· Bleeding from the vagina. This includes light bleeding (spotting). °· Belly (abdomen) or lower belly cramping or pain. This may be felt on one side. °· A fast heartbeat (pulse). °· Passing out (fainting) after going poop (bowel movement). ° °If your tube tears, you may have symptoms such as: °· Really bad pain in the belly or lower belly. This happens suddenly. °· Dizziness. °· Passing out. °· Shoulder pain. ° °Get help right away if: °You have any of these symptoms. This is an emergency. °This information is not intended to replace advice given to you by your health care provider. Make sure you discuss any questions you have with your health care provider. °Document Released: 05/27/2008 Document Revised: 08/06/2015 Document Reviewed: 10/10/2012 °Elsevier Interactive Patient Education © 2017 [employer] Inc. ° °

## 2017-06-03 DIAGNOSIS — Z5321 Procedure and treatment not carried out due to patient leaving prior to being seen by health care provider: Secondary | ICD-10-CM | POA: Insufficient documentation

## 2017-06-03 DIAGNOSIS — K0889 Other specified disorders of teeth and supporting structures: Secondary | ICD-10-CM | POA: Insufficient documentation

## 2017-06-04 ENCOUNTER — Encounter (HOSPITAL_COMMUNITY): Payer: Self-pay | Admitting: Emergency Medicine

## 2017-06-04 ENCOUNTER — Emergency Department (HOSPITAL_COMMUNITY)
Admission: EM | Admit: 2017-06-04 | Discharge: 2017-06-04 | Disposition: A | Discharge status: Home / Self Care | Payer: Self-pay | Attending: Emergency Medicine | Admitting: Emergency Medicine

## 2017-06-04 NOTE — ED Triage Notes (Addendum)
Pt is currently being treated for an infection involving her wisdom teeth.  She is taking antibiotic and has pain medication but woke up w/ increased swelling.  Pt reports she is having trouble swallowing however she is able to speak in complete sentences and not in respiratory distress at this time.  The plan is to remove them on Monday.

## 2017-06-04 NOTE — ED Notes (Signed)
Pt told writer that she was leaving.  

## 2017-06-26 ENCOUNTER — Encounter (HOSPITAL_COMMUNITY): Payer: Self-pay

## 2018-07-13 ENCOUNTER — Other Ambulatory Visit (HOSPITAL_COMMUNITY): Payer: Self-pay | Admitting: Nurse Practitioner

## 2018-07-13 DIAGNOSIS — Z369 Encounter for antenatal screening, unspecified: Secondary | ICD-10-CM

## 2018-07-13 LAB — OB RESULTS CONSOLE GC/CHLAMYDIA
Chlamydia: NEGATIVE
Gonorrhea: NEGATIVE

## 2018-07-13 LAB — OB RESULTS CONSOLE RPR: RPR: NONREACTIVE

## 2018-07-13 LAB — OB RESULTS CONSOLE ANTIBODY SCREEN: Antibody Screen: NEGATIVE

## 2018-07-13 LAB — OB RESULTS CONSOLE HEPATITIS B SURFACE ANTIGEN: Hepatitis B Surface Ag: NEGATIVE

## 2018-07-13 LAB — OB RESULTS CONSOLE ABO/RH
ABO/RH(D): POSITIVE
RH Type: POSITIVE

## 2018-07-13 LAB — OB RESULTS CONSOLE HIV ANTIBODY (ROUTINE TESTING): HIV: NONREACTIVE

## 2018-07-13 LAB — OB RESULTS CONSOLE HGB/HCT, BLOOD
HCT: 35 (ref 29–41)
Hemoglobin: 11.5

## 2018-07-13 LAB — OB RESULTS CONSOLE RUBELLA ANTIBODY, IGM: Rubella: IMMUNE

## 2018-07-13 LAB — OB RESULTS CONSOLE PLATELET COUNT: Platelets: 387

## 2018-07-16 ENCOUNTER — Other Ambulatory Visit: Payer: Self-pay | Admitting: Nurse Practitioner

## 2018-07-16 DIAGNOSIS — N6310 Unspecified lump in the right breast, unspecified quadrant: Secondary | ICD-10-CM

## 2018-07-20 ENCOUNTER — Encounter (HOSPITAL_COMMUNITY): Payer: Self-pay | Admitting: *Deleted

## 2018-07-23 ENCOUNTER — Encounter (HOSPITAL_COMMUNITY): Payer: Self-pay

## 2018-07-23 ENCOUNTER — Ambulatory Visit (HOSPITAL_COMMUNITY): Payer: Self-pay

## 2018-07-24 ENCOUNTER — Other Ambulatory Visit: Payer: Self-pay

## 2018-07-24 ENCOUNTER — Ambulatory Visit
Admission: RE | Admit: 2018-07-24 | Discharge: 2018-07-24 | Disposition: A | Discharge status: Home / Self Care | Payer: Medicaid Other | Source: Ambulatory Visit | Attending: Nurse Practitioner | Admitting: Nurse Practitioner

## 2018-07-24 DIAGNOSIS — N6310 Unspecified lump in the right breast, unspecified quadrant: Secondary | ICD-10-CM

## 2018-07-25 ENCOUNTER — Ambulatory Visit (HOSPITAL_COMMUNITY)
Admission: RE | Admit: 2018-07-25 | Discharge: 2018-07-25 | Disposition: A | Discharge status: Home / Self Care | Payer: Medicaid Other | Source: Ambulatory Visit | Attending: Obstetrics and Gynecology | Admitting: Obstetrics and Gynecology

## 2018-07-25 ENCOUNTER — Other Ambulatory Visit: Payer: Medicaid Other

## 2018-07-25 ENCOUNTER — Ambulatory Visit (HOSPITAL_COMMUNITY): Payer: Medicaid Other

## 2018-07-25 ENCOUNTER — Encounter (HOSPITAL_COMMUNITY): Payer: Self-pay

## 2018-07-25 HISTORY — DX: Unspecified ectopic pregnancy without intrauterine pregnancy: O00.90

## 2018-07-25 HISTORY — DX: Depression, unspecified: F32.A

## 2018-07-25 HISTORY — DX: Major depressive disorder, single episode, unspecified: F32.9

## 2018-09-28 ENCOUNTER — Other Ambulatory Visit: Payer: Self-pay

## 2018-09-28 ENCOUNTER — Encounter: Payer: Self-pay | Admitting: Medical

## 2018-09-28 ENCOUNTER — Ambulatory Visit (INDEPENDENT_AMBULATORY_CARE_PROVIDER_SITE_OTHER): Payer: Medicaid Other | Admitting: Medical

## 2018-09-28 VITALS — BP 125/82 | HR 79 | Wt 196.6 lb

## 2018-09-28 DIAGNOSIS — Z8659 Personal history of other mental and behavioral disorders: Secondary | ICD-10-CM

## 2018-09-28 DIAGNOSIS — F1290 Cannabis use, unspecified, uncomplicated: Secondary | ICD-10-CM | POA: Insufficient documentation

## 2018-09-28 DIAGNOSIS — Z3A22 22 weeks gestation of pregnancy: Secondary | ICD-10-CM

## 2018-09-28 DIAGNOSIS — Z3481 Encounter for supervision of other normal pregnancy, first trimester: Secondary | ICD-10-CM

## 2018-09-28 DIAGNOSIS — Z98891 History of uterine scar from previous surgery: Secondary | ICD-10-CM

## 2018-09-28 DIAGNOSIS — O99891 Other specified diseases and conditions complicating pregnancy: Secondary | ICD-10-CM

## 2018-09-28 DIAGNOSIS — O0992 Supervision of high risk pregnancy, unspecified, second trimester: Secondary | ICD-10-CM

## 2018-09-28 DIAGNOSIS — O0990 Supervision of high risk pregnancy, unspecified, unspecified trimester: Secondary | ICD-10-CM | POA: Insufficient documentation

## 2018-09-28 DIAGNOSIS — O4402 Placenta previa specified as without hemorrhage, second trimester: Secondary | ICD-10-CM

## 2018-09-28 DIAGNOSIS — O9989 Other specified diseases and conditions complicating pregnancy, childbirth and the puerperium: Secondary | ICD-10-CM

## 2018-09-28 DIAGNOSIS — O365921 Maternal care for other known or suspected poor fetal growth, second trimester, fetus 1: Secondary | ICD-10-CM

## 2018-09-28 NOTE — Patient Instructions (Signed)
Childbirth Education Options: [facility] Department Classes:  Childbirth education classes can help you get ready for a positive parenting experience. You can also meet other expectant parents and get free stuff for your baby. Each class runs for five weeks on the same night and costs $45 for the mother-to-be and her support person. Medicaid covers the cost if you are eligible. Call [phone] to register. [facility] Childbirth Education:  [phone] or [phone] or [email or web address]  Baby & Me Class: Discuss newborn & infant parenting and family adjustment issues with other new mothers in a relaxed environment. Each week brings a new speaker or baby-centered activity. We encourage new mothers to join [place] every Thursday at 11:00am. Babies birth until crawling. No registration or fee. Daddy [employer]: This course offers Dads-to-be the tools and knowledge needed to feel confident on their journey to becoming new fathers. Experienced dads, who have been trained as coaches, teach dads-to-be how to hold, comfort, diaper, swaddle and play with their infant while being able to support the new mom as well. A class for men taught by men. $25/dad Big Brother/Big Sister: Let your children share in the joy of a new brother or sister in this special class designed just for them. Class includes discussion about how families care for babies: swaddling, holding, diapering, safety as well as how they can be helpful in their new role. This class is designed for children ages 45 to 48, but any age is welcome. Please register each child individually. $5/child  Mom Talk: This mom-led group offers support and connection to mothers as they journey through the adjustments and struggles of that sometimes overwhelming first year after the birth of a child. Tuesdays at 10:00am and Thursdays at 6:00pm. Babies welcome. No registration or fee. Breastfeeding Support Group: This group is a mother-to-mother  support circle where moms have the opportunity to share their breastfeeding experiences. A Lactation Consultant is present for questions and concerns. Meets each Tuesday at 11:00am. No fee or registration. Breastfeeding Your Baby: Learn what to expect in the first days of breastfeeding your newborn.  This class will help you feel more confident with the skills needed to begin your breastfeeding experience. Many new mothers are concerned about breastfeeding after leaving the hospital. This class will also address the most common fears and challenges about breastfeeding during the first few weeks, months and beyond. (call for fee) Comfort Techniques and Tour: This 2 hour interactive class will provide you the opportunity to learn & practice hands-on techniques that can help relieve some of the discomfort of labor and encourage your baby to rotate toward the best position for birth. You and your partner will be able to try a variety of labor positions with birth balls and rebozos as well as practice breathing, relaxation, and visualization techniques. A tour of the [facility] is included with this class. $20 per registrant and support person Childbirth Class- Weekend Option: This class is a Weekend version of our Birth & Baby series. It is designed for parents who have a difficult time fitting several weeks of classes into their schedule. It covers the care of your newborn and the basics of labor and childbirth. It also includes a [place] of [facility] and lunch. The class is held two consecutive days: beginning on Friday evening from 6:30 - 8:30 p.m. and the next day, Saturday from 9 a.m. - 4 p.m. (call for fee) Doren Custard Class: Interested in a waterbirth?  This  informational class will help you discover whether waterbirth is the right fit for you. Education about waterbirth itself, supplies you would need and how to assemble your support team is what you can  expect from this class. Some obstetrical practices require this class in order to pursue a waterbirth. (Not all obstetrical practices offer waterbirth-check with your healthcare provider.) Register only the expectant mom, but you are encouraged to bring your partner to class! Required if planning waterbirth, no fee. Infant/Child CPR: Parents, grandparents, babysitters, and friends learn Cardio-Pulmonary Resuscitation skills for infants and children. You will also learn how to treat both conscious and unconscious choking in infants and children. This Family & Friends program does not offer certification. Register each participant individually to ensure that enough mannequins are available. (Call for fee) Grandparent Love: Expecting a grandbaby? This class is for you! Learn about the latest infant care and safety recommendations and ways to support your own child as he or she transitions into the parenting role. Taught by Registered Nurses who are childbirth instructors, but most importantly...they are grandmothers too! $10/person. Childbirth Class- Natural Childbirth: This series of 5 weekly classes is for expectant parents who want to learn and practice natural methods of coping with the process of labor and childbirth. Relaxation, breathing, massage, visualization, role of the partner, and helpful positioning are highlighted. Participants learn how to be confident in their body's ability to give birth. This class will empower and help parents make informed decisions about their own care. Includes discussion that will help new parents transition into the immediate postpartum period. Maternity Care Center Tour of [facility] is included. We suggest taking this class between 25-32 weeks, but it's only a recommendation. $75 per registrant and one support person or $30 Medicaid. Childbirth Class- 3 week Series: This option of 3 weekly classes helps you and your labor partner prepare for childbirth. Newborn  care, labor & birth, cesarean birth, pain management, and comfort techniques are discussed and a Maternity Care Center Tour of [facility] is included. The class meets at the same time, on the same day of the week for 3 consecutive weeks beginning with the starting date you choose. $60 for registrant and one support person.  Marvelous Multiples: Expecting twins, triplets, or more? This class covers the differences in labor, birth, parenting, and breastfeeding issues that face multiples' parents. NICU tour is included. Led by a Certified Childbirth Educator who is the mother of twins. No fee. Caring for Baby: This class is for expectant and adoptive parents who want to learn and practice the most up-to-date newborn care for their babies. Focus is on birth through the first six weeks of life. Topics include feeding, bathing, diapering, crying, umbilical cord care, circumcision care and safe sleep. Parents learn to recognize symptoms of illness and when to call the pediatrician. Register only the mom-to-be and your partner or support person can plan to come with you! $10 per registrant and support person Childbirth Class- online option: This online class offers you the freedom to complete a Birth and Baby series in the comfort of your own home. The flexibility of this option allows you to review sections at your own pace, at times convenient to you and your support people. It includes additional video information, animations, quizzes, and extended activities. Get organized with helpful eClass tools, checklists, and trackers. Once you register online for the class, you will receive an email within a few days to accept the invitation and begin the class when the time   is right for you. The content will be available to you for 60 days. $60 for 60 days of online access for you and your support people.  Local Doulas: Natural Baby Doulas [email or web address] Tel: [phone]  [email or web address] [employer] [phone] [email or web address] The Labor Hassell Halim  (also do waterbirth tub rental) [phone] [email or web address] Triad Birth Doula [phone] [email or web address].fi Sacred Rhythms  [phone] [email or web address]/ [employer] (PADA) [email or web address] [place] Birth and Baby  [email or web address] Considering Waterbirth? Guide for patients at [facility] [employer]  Why consider waterbirth?  . Gentle birth for babies . Less pain medicine used in labor . May allow for passive descent/less pushing . May reduce perineal tears  . More mobility and instinctive maternal position changes . Increased maternal relaxation . Reduced blood pressure in labor  Is waterbirth safe? What are the risks of infection, drowning or other complications?  . Infection: o Very low risk (3.7 % for tub vs 4.8% for bed) o 7 in 8000 waterbirths with documented infection o Poorly cleaned equipment most common cause o Slightly lower group B strep transmission rate  . Drowning o Maternal:  - Very low risk   - Related to seizures or fainting o Newborn:  - Very low risk. No evidence of increased risk of respiratory problems in multiple large studies - Physiological protection from breathing under water - Avoid underwater birth if there are any fetal complications - Once baby's head is out of the water, keep it out.  . Birth complication o Some reports of cord trauma, but risk decreased by bringing baby to surface gradually o No evidence of increased risk of shoulder dystocia. Mothers can usually change positions faster in water than in a bed, possibly aiding the maneuvers to free the shoulder.   You must attend a Doren Custard class at [facility]  3rd  Wednesday of every month from 7-9pm  [employer] by calling [phone] or online at [email or web address]  Bring [place] the certificate from the class to your prenatal appointment  Meet with a midwife at 36 weeks to see if you can still plan a waterbirth and to sign the consent.   Purchase or rent the following supplies:   Water Birth Pool (Birth Pool in a Box or [place] for instance)  (Tubs start ~$125)  Single-use disposable tub liner designed for your brand of tub  New garden hose labeled "lead-free", "suitable for drinking water",  Electric drain pump to remove water (We recommend 792 gallon per hour or greater pump.)   Separate garden hose to remove the dirty water  Fish net  Bathing suit top (optional)  Long-handled mirror (optional)  Places to purchase or rent supplies  GotWebTools.is for tub purchases and supplies  Waterbirthsolutions.com for tub purchases and supplies  The Labor Ladies (www.[email or web address]) $275 for tub rental/set-up & take down/kit   [employer] (http://www.fleming.com/.htm) Information regarding doulas (labor support) who provide pool rentals  Our practice has a Birth Pool in a Box tub at the hospital that you may borrow on a first-come-first-served basis. It is your responsibility to to set up, clean and break down the tub. We cannot guarantee the availability of this tub in advance. You are responsible for bringing all accessories listed above. If you do not have all necessary supplies you cannot have a waterbirth.    Things that would prevent you from having a waterbirth:  Premature, <37wks  Previous cesarean birth  Presence of thick meconium-stained fluid  Multiple gestation (Twins,  triplets, etc.)  Uncontrolled diabetes or gestational diabetes requiring medication  Hypertension requiring medication or diagnosis of pre-eclampsia  Heavy vaginal bleeding  Non-reassuring fetal heart rate  Active  infection (MRSA, etc.). Group B Strep is NOT a contraindication for  waterbirth.  If your labor has to be induced and induction method requires continuous  monitoring of the baby's heart rate  Other risks/issues identified by your obstetrical provider  Please remember that birth is unpredictable. Under certain unforeseeable circumstances your provider may advise against giving birth in the tub. These decisions will be made on a case-by-case basis and with the safety of you and your baby as our highest priority.    Second Trimester of Pregnancy  The second trimester is from week 14 through week 27 (month 4 through 6). This is often the time in pregnancy that you feel your best. Often times, morning sickness has lessened or quit. You may have more energy, and you may get hungry more often. Your unborn baby is growing rapidly. At the end of the sixth month, he or she is about 9 inches long and weighs about 1 pounds. You will likely feel the baby move between 18 and 20 weeks of pregnancy. Follow these instructions at home: Medicines  Take over-the-counter and prescription medicines only as told by your doctor. Some medicines are safe and some medicines are not safe during pregnancy.  Take a prenatal vitamin that contains at least 600 micrograms (mcg) of folic acid.  If you have trouble pooping (constipation), take medicine that will make your stool soft (stool softener) if your doctor approves. Eating and drinking   Eat regular, healthy meals.  Avoid raw meat and uncooked cheese.  If you get low calcium from the food you eat, talk to your doctor about taking a daily calcium supplement.  Avoid foods that are high in fat and sugars, such as fried and sweet foods.  If you feel sick to your stomach (nauseous) or throw up (vomit): ? Eat 4 or 5 small meals a day instead of 3 large meals. ? Try eating a few soda crackers. ? Drink liquids between meals instead of during meals.  To prevent  constipation: ? Eat foods that are high in fiber, like fresh fruits and vegetables, whole grains, and beans. ? Drink enough fluids to keep your pee (urine) clear or pale yellow. Activity  Exercise only as told by your doctor. Stop exercising if you start to have cramps.  Do not exercise if it is too hot, too humid, or if you are in a place of great height (high altitude).  Avoid heavy lifting.  Wear low-heeled shoes. Sit and stand up straight.  You can continue to have sex unless your doctor tells you not to. Relieving pain and discomfort  Wear a good support bra if your breasts are tender.  Take warm water baths (sitz baths) to soothe pain or discomfort caused by hemorrhoids. Use hemorrhoid cream if your doctor approves.  Rest with your legs raised if you have leg cramps or low back pain.  If you develop puffy, bulging veins (varicose veins) in your legs: ? Wear support hose or compression stockings as told by your doctor. ? Raise (elevate) your feet for 15 minutes, 3-4 times a day. ? Limit salt in your food. Prenatal care  Write down your questions. Take them to your prenatal visits.  Keep all your prenatal visits as told by your doctor. This is important. Safety  Wear your seat belt when  driving.  Make a list of emergency phone numbers, including numbers for family, friends, the hospital, and police and fire departments. General instructions  Ask your doctor about the right foods to eat or for help finding a counselor, if you need these services.  Ask your doctor about local prenatal classes. Begin classes before month 6 of your pregnancy.  Do not use hot tubs, steam rooms, or saunas.  Do not douche or use tampons or scented sanitary pads.  Do not cross your legs for long periods of time.  Visit your dentist if you have not done so. Use a soft toothbrush to brush your teeth. Floss gently.  Avoid all smoking, herbs, and alcohol. Avoid drugs that are not approved by  your doctor.  Do not use any products that contain nicotine or tobacco, such as cigarettes and e-cigarettes. If you need help quitting, ask your doctor.  Avoid cat litter boxes and soil used by cats. These carry germs that can cause birth defects in the baby and can cause a loss of your baby (miscarriage) or stillbirth. Contact a doctor if:  You have mild cramps or pressure in your lower belly.  You have pain when you pee (urinate).  You have bad smelling fluid coming from your vagina.  You continue to feel sick to your stomach (nauseous), throw up (vomit), or have watery poop (diarrhea).  You have a nagging pain in your belly area.  You feel dizzy. Get help right away if:  You have a fever.  You are leaking fluid from your vagina.  You have spotting or bleeding from your vagina.  You have severe belly cramping or pain.  You lose or gain weight rapidly.  You have trouble catching your breath and have chest pain.  You notice sudden or extreme puffiness (swelling) of your face, hands, ankles, feet, or legs.  You have not felt the baby move in over an hour.  You have severe headaches that do not go away when you take medicine.  You have trouble seeing. Summary  The second trimester is from week 14 through week 27 (months 4 through 6). This is often the time in pregnancy that you feel your best.  To take care of yourself and your unborn baby, you will need to eat healthy meals, take medicines only if your doctor tells you to do so, and do activities that are safe for you and your baby.  Call your doctor if you get sick or if you notice anything unusual about your pregnancy. Also, call your doctor if you need help with the right food to eat, or if you want to know what activities are safe for you. This information is not intended to replace advice given to you by your health care provider. Make sure you discuss any questions you have with your health care provider. Document  Released: 05/25/2009 Document Revised: 06/22/2018 Document Reviewed: 04/05/2016 Elsevier Patient Education  2020 [employer].

## 2018-09-28 NOTE — Progress Notes (Signed)
   PRENATAL VISIT NOTE  Subjective:  [name] is a 33 y.o. [phone] at [redacted]w[redacted]d being seen today for ongoing prenatal care. She is a transfer from the [facility] for complete placenta previa She is currently monitored for the following issues for this high-risk pregnancy and has PAP SMEAR, ABNORMAL; S/P cesarean section; History of postpartum depression, currently pregnant; Encounter for supervision of normal pregnancy, unspecified, unspecified trimester; and Placenta previa antepartum in second trimester on their problem list.  Patient reports no complaints.  Contractions: Not present. Vag. Bleeding: None.  Movement: Present. Denies leaking of fluid.   The following portions of the patient's history were reviewed and updated as appropriate: allergies, current medications, past family history, past medical history, past social history, past surgical history and problem list.   Objective:   Vitals:   09/28/18 0954  BP: 125/82  Pulse: 79  Weight: 196 lb 9.6 oz (89.2 kg)    Fetal Status: Fetal Heart Rate (bpm): 150 Fundal Height: 22 cm Movement: Present     General:  Alert, oriented and cooperative. Patient is in no acute distress.  Skin: Skin is warm and dry. No rash noted.   Cardiovascular: Normal heart rate noted  Respiratory: Normal respiratory effort, no problems with respiration noted  Abdomen: Soft, gravid, appropriate for gestational age.  Pain/Pressure: Present     Pelvic: Cervical exam deferred        Extremities: Normal range of motion.  Edema: None  Mental Status: Normal mood and affect. Normal behavior. Normal judgment and thought content.   Assessment and Plan:  Pregnancy: G4P1021 at [redacted]w[redacted]d 1. Encounter for supervision of high risk pregnancy, antepartum  2. S/P cesarean section - Planning repeat and desires BTL   3. History of postpartum depression, currently pregnant - No medications currently   4. Placenta previa antepartum in second trimester - Planning  C/S - Pelvic rest advised   5. IUGR - [place] from [place] shows EFW in 6%tile - [place] MFM OB FOLLOW UP - growth   Preterm labor symptoms and general obstetric precautions including but not limited to vaginal bleeding, contractions, leaking of fluid and fetal movement were reviewed in detail with the patient. Please refer to After Visit Summary for other counseling recommendations.   Return in about 4 weeks (around 10/26/2018) for Virtual, [facility] with MD.  No future appointments.  Kerry Hough, PA-C

## 2018-09-28 NOTE — Progress Notes (Signed)
Pt presents for new ob. She is a transfer for [facility]. Pt complains of bleeding from both nipples. She states that she had her nipples pierced and removed the piercing early in pregnancy. Pt also complains of having rashes on her body. She has no other concerns.

## 2018-09-30 LAB — URINE CULTURE, OB REFLEX

## 2018-09-30 LAB — CULTURE, OB URINE

## 2018-10-01 ENCOUNTER — Other Ambulatory Visit: Payer: Self-pay

## 2018-10-01 ENCOUNTER — Telehealth: Payer: Self-pay

## 2018-10-01 DIAGNOSIS — O4402 Placenta previa specified as without hemorrhage, second trimester: Secondary | ICD-10-CM

## 2018-10-01 MED ORDER — BLOOD PRESSURE KIT: PACK | 0 refills | Status: AC

## 2018-10-01 NOTE — Telephone Encounter (Signed)
Returned call, no answer, left vm 

## 2018-10-01 NOTE — Progress Notes (Signed)
Pt needs BP cuff order to be faxed to pharmacy.

## 2018-10-01 NOTE — Progress Notes (Signed)
u

## 2018-10-10 ENCOUNTER — Other Ambulatory Visit: Payer: Self-pay

## 2018-10-10 ENCOUNTER — Ambulatory Visit (HOSPITAL_COMMUNITY): Payer: Medicaid Other | Admitting: *Deleted

## 2018-10-10 ENCOUNTER — Encounter (HOSPITAL_COMMUNITY): Payer: Self-pay

## 2018-10-10 ENCOUNTER — Ambulatory Visit (HOSPITAL_COMMUNITY)
Admission: RE | Admit: 2018-10-10 | Discharge: 2018-10-10 | Disposition: A | Discharge status: Home / Self Care | Payer: Medicaid Other | Source: Ambulatory Visit | Attending: Obstetrics and Gynecology | Admitting: Obstetrics and Gynecology

## 2018-10-10 ENCOUNTER — Other Ambulatory Visit: Payer: Self-pay | Admitting: Medical

## 2018-10-10 DIAGNOSIS — O34219 Maternal care for unspecified type scar from previous cesarean delivery: Secondary | ICD-10-CM | POA: Diagnosis not present

## 2018-10-10 DIAGNOSIS — O4402 Placenta previa specified as without hemorrhage, second trimester: Secondary | ICD-10-CM | POA: Insufficient documentation

## 2018-10-10 DIAGNOSIS — Z3A24 24 weeks gestation of pregnancy: Secondary | ICD-10-CM | POA: Diagnosis not present

## 2018-10-10 DIAGNOSIS — O0990 Supervision of high risk pregnancy, unspecified, unspecified trimester: Secondary | ICD-10-CM | POA: Insufficient documentation

## 2018-10-11 ENCOUNTER — Other Ambulatory Visit (HOSPITAL_COMMUNITY): Payer: Self-pay | Admitting: *Deleted

## 2018-10-11 DIAGNOSIS — O4403 Placenta previa specified as without hemorrhage, third trimester: Secondary | ICD-10-CM

## 2018-10-15 ENCOUNTER — Telehealth: Payer: Self-pay

## 2018-10-15 NOTE — Telephone Encounter (Signed)
Pt called and reports she has bad heartburn. Pt reports she has been taking tums but they are not working. I advised pt she can take OTC zantac as directed on the box. Advised pt to let us know if that does not work. Pt verbalizes understanding.

## 2018-10-26 ENCOUNTER — Encounter: Payer: Medicaid Other | Admitting: Obstetrics and Gynecology

## 2018-11-06 ENCOUNTER — Other Ambulatory Visit: Payer: Medicaid Other

## 2018-11-06 ENCOUNTER — Other Ambulatory Visit: Payer: Self-pay

## 2018-11-06 ENCOUNTER — Encounter: Payer: Self-pay | Admitting: Obstetrics & Gynecology

## 2018-11-06 ENCOUNTER — Ambulatory Visit (INDEPENDENT_AMBULATORY_CARE_PROVIDER_SITE_OTHER): Payer: Medicaid Other | Admitting: Obstetrics & Gynecology

## 2018-11-06 VITALS — BP 128/81 | HR 85 | Temp 97.7°F | Wt 201.0 lb

## 2018-11-06 DIAGNOSIS — O0992 Supervision of high risk pregnancy, unspecified, second trimester: Secondary | ICD-10-CM

## 2018-11-06 DIAGNOSIS — O365921 Maternal care for other known or suspected poor fetal growth, second trimester, fetus 1: Secondary | ICD-10-CM

## 2018-11-06 DIAGNOSIS — O34211 Maternal care for low transverse scar from previous cesarean delivery: Secondary | ICD-10-CM

## 2018-11-06 DIAGNOSIS — O0990 Supervision of high risk pregnancy, unspecified, unspecified trimester: Secondary | ICD-10-CM

## 2018-11-06 DIAGNOSIS — Z98891 History of uterine scar from previous surgery: Secondary | ICD-10-CM

## 2018-11-06 DIAGNOSIS — Z3009 Encounter for other general counseling and advice on contraception: Secondary | ICD-10-CM

## 2018-11-06 DIAGNOSIS — Z23 Encounter for immunization: Secondary | ICD-10-CM

## 2018-11-06 DIAGNOSIS — E6609 Other obesity due to excess calories: Secondary | ICD-10-CM

## 2018-11-06 DIAGNOSIS — O365922 Maternal care for other known or suspected poor fetal growth, second trimester, fetus 2: Secondary | ICD-10-CM

## 2018-11-06 DIAGNOSIS — Z3A27 27 weeks gestation of pregnancy: Secondary | ICD-10-CM

## 2018-11-06 NOTE — Progress Notes (Signed)
CC :None   T dap declined.

## 2018-11-06 NOTE — Progress Notes (Signed)
   PRENATAL VISIT NOTE  Subjective:  [name] is a 33 y.o. [phone] at [redacted]w[redacted]d being seen today for ongoing prenatal care.  She is currently monitored for the following issues for this high-risk pregnancy and has PAP SMEAR, ABNORMAL; S/P cesarean section; History of postpartum depression, currently pregnant; Supervision of high risk pregnancy, antepartum; Placenta previa antepartum in second trimester; and Marijuana use on their problem list.  Patient reports no complaints.  Contractions: Not present. Vag. Bleeding: None.  Movement: Present. Denies leaking of fluid.   The following portions of the patient's history were reviewed and updated as appropriate: allergies, current medications, past family history, past medical history, past social history, past surgical history and problem list.   Objective:   Vitals:   11/06/18 0844  BP: 128/81  Pulse: 85  Weight: 201 lb (91.2 kg)    Fetal Status:     Movement: Present     General:  Alert, oriented and cooperative. Patient is in no acute distress.  Skin: Skin is warm and dry. No rash noted.   Cardiovascular: Normal heart rate noted  Respiratory: Normal respiratory effort, no problems with respiration noted  Abdomen: Soft, gravid, appropriate for gestational age.  Pain/Pressure: Present     Pelvic: Cervical exam deferred        Extremities: Normal range of motion.  Edema: None  Mental Status: Normal mood and affect. Normal behavior. Normal judgment and thought content.   Assessment and Plan:  Pregnancy: G4P1021 at [redacted]w[redacted]d 1. Supervision of high risk pregnancy, antepartum - 2 hour GTT, labs - TDAP and flu vaccines today  2. S/P cesarean section - wants RCS  3. Unwanted fertility- sign consent today  4. Placenta previa- follow up u/s with MFM q 4 weeks  - continue pelvic rest - bleeding precautions reviewed  Preterm labor symptoms and general obstetric precautions including but not limited to vaginal bleeding,  contractions, leaking of fluid and fetal movement were reviewed in detail with the patient. Please refer to After Visit Summary for other counseling recommendations.   Return in about 2 weeks (around 11/20/2018) for in person.  Future Appointments  Date Time Provider [place]  11/06/2018  9:45 AM Emily Filbert, MD [facility] None  11/07/2018  2:30 PM [facility] [place] 1 [facility]    Emily Filbert, MD

## 2018-11-06 NOTE — Addendum Note (Signed)
Addended by: Courtney Heys on: 11/06/2018 09:35 AM   Modules accepted: Orders

## 2018-11-07 ENCOUNTER — Other Ambulatory Visit (HOSPITAL_COMMUNITY): Payer: Self-pay | Admitting: Obstetrics and Gynecology

## 2018-11-07 ENCOUNTER — Other Ambulatory Visit (HOSPITAL_COMMUNITY): Payer: Self-pay | Admitting: *Deleted

## 2018-11-07 ENCOUNTER — Ambulatory Visit (HOSPITAL_COMMUNITY)
Admission: RE | Admit: 2018-11-07 | Discharge: 2018-11-07 | Disposition: A | Discharge status: Home / Self Care | Payer: Medicaid Other | Source: Ambulatory Visit | Attending: Obstetrics and Gynecology | Admitting: Obstetrics and Gynecology

## 2018-11-07 DIAGNOSIS — O4402 Placenta previa specified as without hemorrhage, second trimester: Secondary | ICD-10-CM

## 2018-11-07 DIAGNOSIS — O4403 Placenta previa specified as without hemorrhage, third trimester: Secondary | ICD-10-CM | POA: Diagnosis present

## 2018-11-07 DIAGNOSIS — Z362 Encounter for other antenatal screening follow-up: Secondary | ICD-10-CM

## 2018-11-07 DIAGNOSIS — Z3A28 28 weeks gestation of pregnancy: Secondary | ICD-10-CM

## 2018-11-07 DIAGNOSIS — O34219 Maternal care for unspecified type scar from previous cesarean delivery: Secondary | ICD-10-CM

## 2018-11-07 LAB — CBC
Hematocrit: 32.5 % — ABNORMAL LOW (ref 34.0–46.6)
Hemoglobin: 10.3 g/dL — ABNORMAL LOW (ref 11.1–15.9)
MCH: 24.7 pg — ABNORMAL LOW (ref 26.6–33.0)
MCHC: 31.7 g/dL (ref 31.5–35.7)
MCV: 78 fL — ABNORMAL LOW (ref 79–97)
Platelets: 391 10*3/uL (ref 150–450)
RBC: 4.17 x10E6/uL (ref 3.77–5.28)
RDW: 13.6 % (ref 11.7–15.4)
WBC: 11.5 10*3/uL — ABNORMAL HIGH (ref 3.4–10.8)

## 2018-11-07 LAB — GLUCOSE TOLERANCE, 2 HOURS W/ 1HR
Glucose, 1 hour: 88 mg/dL (ref 65–179)
Glucose, 2 hour: 117 mg/dL (ref 65–152)
Glucose, Fasting: 82 mg/dL (ref 65–91)

## 2018-11-07 LAB — HIV ANTIBODY (ROUTINE TESTING W REFLEX): HIV Screen 4th Generation wRfx: NONREACTIVE

## 2018-11-07 LAB — RPR: RPR Ser Ql: NONREACTIVE

## 2018-11-12 NOTE — Progress Notes (Signed)
OB Abstraction.

## 2018-11-16 ENCOUNTER — Ambulatory Visit: Payer: Self-pay | Admitting: *Deleted

## 2018-11-16 NOTE — Telephone Encounter (Signed)
Pt called stating that she has placenta previa and is having severe abdominal pain; she says that it started around 1200; she denies vaginal bleeding or fluid leakage; she is feeling pelvic pressure; the pt says her OBs office is closed;  recommendations made per nurse triage protocol; she verbalizes understanding, and says that she will wait before going to L&D.  Reason for Disposition . Sounds like a life-threatening emergency to the triager  Answer Assessment - Initial Assessment Questions 1. LOCATION: "Where does it hurt?"      Under belly near vaginal area/pelvic pressure 2. RADIATION: "Does the pain shoot anywhere else?" (e.g., chest, back)    no 3. ONSET: "When did the pain begin?" (Minutes, hours or days ago)     11/16/2018 1200 4. ONSET: "Gradual or sudden onset?"     sudden 5. PATTERN: "Does the pain come and go, or has it been constant since it started?"      constant 6. SEVERITY: "How bad is the pain?" "What does it keep you from doing?"  (e.g., Scale 1-10; mild, moderate, or severe)   - MILD (1-3): doesn't interfere with normal activities, abdomen soft and not tender to touch    - MODERATE (4-7): interferes with normal activities or awakens from sleep, tender to touch    - SEVERE (8-10): excruciating pain, doubled over, unable to do any normal activities     5 out of 10 7. RECURRENT SYMPTOM: "Have you ever had this type of abdominal pain before?" If so, ask: "When was the last time?" and "What happened that time?"     no 8. CAUSE: "What do you think is causing the abdominal pain?     Placenta previa 9. RELIEVING/AGGRAVATING FACTORS: "What makes it better or worse?" (e.g., antacids, bowel movement, movement)    no 10. OTHER SYMPTOMS: "Has there been any vaginal bleeding, fever, vomiting, diarrhea, or urine problems?"       no 11. EDD: "What date are you expecting to deliver?"   Nov 2020  Protocols used: PREGNANCY - ABDOMINAL PAIN GREATER THAN [redacted] WEEKS EGA-A-AH

## 2018-11-16 NOTE — Telephone Encounter (Signed)
Notified Mendel Ryder at [employer] for [employer] at [place]; she will have the oncall nurse call the pt.

## 2018-11-24 ENCOUNTER — Inpatient Hospital Stay (HOSPITAL_COMMUNITY)
Admission: AD | Admit: 2018-11-24 | Discharge: 2018-11-24 | Disposition: A | Discharge status: Home / Self Care | Payer: Medicaid Other | Source: Ambulatory Visit | Attending: Obstetrics & Gynecology | Admitting: Obstetrics & Gynecology

## 2018-11-24 ENCOUNTER — Encounter (HOSPITAL_COMMUNITY): Payer: Self-pay | Admitting: *Deleted

## 2018-11-24 ENCOUNTER — Inpatient Hospital Stay (HOSPITAL_BASED_OUTPATIENT_CLINIC_OR_DEPARTMENT_OTHER): Payer: Medicaid Other

## 2018-11-24 ENCOUNTER — Other Ambulatory Visit: Payer: Self-pay

## 2018-11-24 ENCOUNTER — Encounter: Payer: Self-pay | Admitting: Obstetrics and Gynecology

## 2018-11-24 DIAGNOSIS — O4403 Placenta previa specified as without hemorrhage, third trimester: Secondary | ICD-10-CM

## 2018-11-24 DIAGNOSIS — O34219 Maternal care for unspecified type scar from previous cesarean delivery: Secondary | ICD-10-CM | POA: Diagnosis not present

## 2018-11-24 DIAGNOSIS — O9989 Other specified diseases and conditions complicating pregnancy, childbirth and the puerperium: Secondary | ICD-10-CM

## 2018-11-24 DIAGNOSIS — Z3A30 30 weeks gestation of pregnancy: Secondary | ICD-10-CM | POA: Diagnosis not present

## 2018-11-24 DIAGNOSIS — R102 Pelvic and perineal pain: Secondary | ICD-10-CM | POA: Insufficient documentation

## 2018-11-24 DIAGNOSIS — R1030 Lower abdominal pain, unspecified: Secondary | ICD-10-CM | POA: Diagnosis present

## 2018-11-24 DIAGNOSIS — Z87891 Personal history of nicotine dependence: Secondary | ICD-10-CM | POA: Diagnosis not present

## 2018-11-24 DIAGNOSIS — O26893 Other specified pregnancy related conditions, third trimester: Secondary | ICD-10-CM | POA: Diagnosis not present

## 2018-11-24 DIAGNOSIS — M545 Low back pain: Secondary | ICD-10-CM | POA: Insufficient documentation

## 2018-11-24 DIAGNOSIS — M549 Dorsalgia, unspecified: Secondary | ICD-10-CM

## 2018-11-24 DIAGNOSIS — N949 Unspecified condition associated with female genital organs and menstrual cycle: Secondary | ICD-10-CM

## 2018-11-24 LAB — URINALYSIS, ROUTINE W REFLEX MICROSCOPIC
Bilirubin Urine: NEGATIVE
Glucose, UA: NEGATIVE mg/dL
Hgb urine dipstick: NEGATIVE
Ketones, ur: 20 mg/dL — AB
Nitrite: NEGATIVE
Protein, ur: NEGATIVE mg/dL
Specific Gravity, Urine: 1.01 (ref 1.005–1.030)
pH: 6 (ref 5.0–8.0)

## 2018-11-24 LAB — COMPREHENSIVE METABOLIC PANEL
ALT: 13 U/L (ref 0–44)
AST: 16 U/L (ref 15–41)
Albumin: 2.5 g/dL — ABNORMAL LOW (ref 3.5–5.0)
Alkaline Phosphatase: 61 U/L (ref 38–126)
Anion gap: 11 (ref 5–15)
BUN: 7 mg/dL (ref 6–20)
CO2: 18 mmol/L — ABNORMAL LOW (ref 22–32)
Calcium: 9.1 mg/dL (ref 8.9–10.3)
Chloride: 105 mmol/L (ref 98–111)
Creatinine, Ser: 0.48 mg/dL (ref 0.44–1.00)
GFR calc Af Amer: >60 mL/min (ref >60)
GFR calc non Af Amer: >60 mL/min (ref >60)
Glucose, Bld: 138 mg/dL — ABNORMAL HIGH (ref 70–99)
Potassium: 3.5 mmol/L (ref 3.5–5.1)
Sodium: 134 mmol/L — ABNORMAL LOW (ref 135–145)
Total Bilirubin: 0.6 mg/dL (ref 0.3–1.2)
Total Protein: 6.2 g/dL — ABNORMAL LOW (ref 6.5–8.1)

## 2018-11-24 LAB — CBC WITH DIFFERENTIAL/PLATELET
Abs Immature Granulocytes: 0.05 10*3/uL (ref 0.00–0.07)
Basophils Absolute: 0 10*3/uL (ref 0.0–0.1)
Basophils Relative: 0 %
Eosinophils Absolute: 0.1 10*3/uL (ref 0.0–0.5)
Eosinophils Relative: 1 %
HCT: 29.5 % — ABNORMAL LOW (ref 36.0–46.0)
Hemoglobin: 9.6 g/dL — ABNORMAL LOW (ref 12.0–15.0)
Immature Granulocytes: 0 %
Lymphocytes Relative: 18 %
Lymphs Abs: 2.1 10*3/uL (ref 0.7–4.0)
MCH: 24.7 pg — ABNORMAL LOW (ref 26.0–34.0)
MCHC: 32.5 g/dL (ref 30.0–36.0)
MCV: 76 fL — ABNORMAL LOW (ref 80.0–100.0)
Monocytes Absolute: 0.4 10*3/uL (ref 0.1–1.0)
Monocytes Relative: 4 %
Neutro Abs: 9 10*3/uL — ABNORMAL HIGH (ref 1.7–7.7)
Neutrophils Relative %: 77 %
Platelets: 344 10*3/uL (ref 150–400)
RBC: 3.88 MIL/uL (ref 3.87–5.11)
RDW: 14.2 % (ref 11.5–15.5)
WBC: 11.7 10*3/uL — ABNORMAL HIGH (ref 4.0–10.5)
nRBC: 0 % (ref 0.0–0.2)

## 2018-11-24 MED ORDER — CYCLOBENZAPRINE HCL 10 MG PO TABS
10.0000 mg | ORAL_TABLET | Freq: Once | ORAL | Status: AC
  Administered 2018-11-24: 10 mg via ORAL
  Filled 2018-11-24: qty 1

## 2018-11-24 MED ORDER — LACTATED RINGERS IV BOLUS
1000.0000 mL | Freq: Once | INTRAVENOUS | Status: AC
  Administered 2018-11-24: 19:00:00 1000 mL via INTRAVENOUS

## 2018-11-24 MED ORDER — CYCLOBENZAPRINE HCL 10 MG PO TABS: 10.0000 mg | ORAL_TABLET | Freq: Two times a day (BID) | ORAL | 0 refills | Status: DC | PRN

## 2018-11-24 NOTE — MAU Provider Note (Signed)
History     CSN: [id number]  Arrival date and time: 11/24/18 1744   First Provider Initiated Contact with Patient 11/24/18 1822      Chief Complaint  Patient presents with  . Abdominal Pain  . Back Pain   [name] is a 33 y.o. [id number] at 17w3dwho presents today with lower abdominal pain that radiates to her back. She states that she has had the pain all day. She denies any VB or LOF. She reports normal fetal movement.   Abdominal Pain This is a new problem. The current episode started today. The onset quality is sudden. The problem occurs intermittently. The problem has been unchanged. The pain is located in the suprapubic region. The pain is at a severity of 8/10. The quality of the pain is sharp. The abdominal pain radiates to the back. Pertinent negatives include no dysuria, fever, frequency, nausea or vomiting. Nothing aggravates the pain. The pain is relieved by certain positions (getting up and walking helps. ). She has tried nothing for the symptoms.  Back Pain Associated symptoms include abdominal pain and pelvic pain. Pertinent negatives include no dysuria or fever.    OB History    Gravida  4   Para  1   Term  1   Preterm      AB  2   Living  1     SAB      TAB  1   Ectopic  1   Multiple      Live Births  1           Past Medical History:  Diagnosis Date  . Anemia   . Blood transfusion    can't remember dates but NOT with last pregnancy  . Depression    Postpartum  . Ectopic pregnancy   . Menorrhagia 2007   blood transfusion    Past Surgical History:  Procedure Laterality Date  . CESAREAN SECTION  03/28/2012   Procedure: CESAREAN SECTION;  Surgeon: BFrederico Hamman MD;  [facility];  Service: Obstetrics;  Laterality: N/A;  Primary Cesarean Section Delivery Baby Boy @ 0543, Apgars 8/8  . INDUCED ABORTION    . WISDOM TOOTH EXTRACTION  2013    Family History  Problem Relation Age of Onset  . Arthritis Father    . Diabetes Father   . Hypertension Father   . Heart disease Father   . Arthritis Maternal Grandmother   . Depression Maternal Grandmother   . Hypertension Maternal Grandmother   . Heart disease Maternal Grandmother   . Depression Maternal Grandfather   . Arthritis Paternal Grandmother   . Cancer Paternal Grandmother   . Depression Paternal Grandmother   . Breast cancer Paternal Grandmother        661's . Depression Paternal Grandfather   . Other Neg Hx     Social History   Tobacco Use  . Smoking status: Former SResearch scientist (life sciences) . Smokeless tobacco: Never Used  Substance Use Topics  . Alcohol use: No  . Drug use: Yes    Types: Marijuana    Comment: last used over a 1 month ago    Allergies:  Allergies  Allergen Reactions  . Tomato Hives    Medications Prior to Admission  Medication Sig Dispense Refill Last Dose  . Blood Pressure KIT BP to be monitored regularly at home O09.90 Large 1 kit 0   . Prenatal Vit-Fe Fumarate-FA (PRENATAL VITAMIN PO) Take by mouth.  Review of Systems  Constitutional: Negative for chills and fever.  Gastrointestinal: Positive for abdominal pain. Negative for nausea and vomiting.  Genitourinary: Positive for pelvic pain. Negative for dysuria, frequency, vaginal bleeding and vaginal discharge.  Musculoskeletal: Positive for back pain.   Physical Exam   Blood pressure 113/61, pulse 79, temperature 98.1 F (36.7 C), temperature source Oral, resp. rate 16, height '5\' 3"'  (1.6 m), weight 92.7 kg, last menstrual period 04/25/2018, SpO2 99 %, unknown if currently breastfeeding.  Physical Exam  Nursing note and vitals reviewed. Constitutional: She is oriented to person, place, and time. She appears well-developed and well-nourished. No distress.  HENT:  Head: Normocephalic.  Cardiovascular: Normal rate.  Respiratory: Effort normal.  GI: Soft. There is abdominal tenderness. There is no rebound.  Neurological: She is alert and oriented to person,  place, and time.  Skin: Skin is warm and dry.  Psychiatric: She has a normal mood and affect.   NST:  Baseline: 145 Variability: moderate Accels: 15x15 Decels: none Toco: none  Results for orders placed or performed during the hospital encounter of 11/24/18 (from the past 24 hour(s))  Urinalysis, Routine w reflex microscopic     Status: Abnormal   Collection Time: 11/24/18  6:21 PM  Result Value Ref Range   Color, Urine YELLOW YELLOW   APPearance HAZY (A) CLEAR   Specific Gravity, Urine 1.010 1.005 - 1.030   pH 6.0 5.0 - 8.0   Glucose, UA NEGATIVE NEGATIVE mg/dL   Hgb urine dipstick NEGATIVE NEGATIVE   Bilirubin Urine NEGATIVE NEGATIVE   Ketones, ur 20 (A) NEGATIVE mg/dL   Protein, ur NEGATIVE NEGATIVE mg/dL   Nitrite NEGATIVE NEGATIVE   Leukocytes,Ua SMALL (A) NEGATIVE   RBC / HPF 6-10 0 - 5 RBC/hpf   WBC, UA 11-20 0 - 5 WBC/hpf   Bacteria, UA FEW (A) NONE SEEN   Squamous Epithelial / LPF 11-20 0 - 5   Mucus PRESENT    Budding Yeast PRESENT   CBC with Differential/Platelet     Status: Abnormal   Collection Time: 11/24/18  6:43 PM  Result Value Ref Range   WBC 11.7 (H) 4.0 - 10.5 K/uL   RBC 3.88 3.87 - 5.11 MIL/uL   Hemoglobin 9.6 (L) 12.0 - 15.0 g/dL   HCT 29.5 (L) 36.0 - 46.0 %   MCV 76.0 (L) 80.0 - 100.0 fL   MCH 24.7 (L) 26.0 - 34.0 pg   MCHC 32.5 30.0 - 36.0 g/dL   RDW 14.2 11.5 - 15.5 %   Platelets 344 150 - 400 K/uL   nRBC 0.0 0.0 - 0.2 %   Neutrophils Relative % 77 %   Neutro Abs 9.0 (H) 1.7 - 7.7 K/uL   Lymphocytes Relative 18 %   Lymphs Abs 2.1 0.7 - 4.0 K/uL   Monocytes Relative 4 %   Monocytes Absolute 0.4 0.1 - 1.0 K/uL   Eosinophils Relative 1 %   Eosinophils Absolute 0.1 0.0 - 0.5 K/uL   Basophils Relative 0 %   Basophils Absolute 0.0 0.0 - 0.1 K/uL   Immature Granulocytes 0 %   Abs Immature Granulocytes 0.05 0.00 - 0.07 K/uL  Comprehensive metabolic panel     Status: Abnormal   Collection Time: 11/24/18  6:43 PM  Result Value Ref Range    Sodium 134 (L) 135 - 145 mmol/L   Potassium 3.5 3.5 - 5.1 mmol/L   Chloride 105 98 - 111 mmol/L   CO2 18 (L) 22 - 32 mmol/L  Glucose, Bld 138 (H) 70 - 99 mg/dL   BUN 7 6 - 20 mg/dL   Creatinine, Ser 0.48 0.44 - 1.00 mg/dL   Calcium 9.1 8.9 - 10.3 mg/dL   Total Protein 6.2 (L) 6.5 - 8.1 g/dL   Albumin 2.5 (L) 3.5 - 5.0 g/dL   AST 16 15 - 41 U/L   ALT 13 0 - 44 U/L   Alkaline Phosphatase 61 38 - 126 U/L   Total Bilirubin 0.6 0.3 - 1.2 mg/dL   GFR calc non Af Amer >60 >60 mL/min   GFR calc Af Amer >60 >60 mL/min   Anion gap 11 5 - 15    MAU Course  Procedures  MDM MFM [place]: Cephalic, posterior previa, AFI 19.47cm  Patient has had LR bolus and flexeril. She reports that she is feeling better.   Assessment and Plan   1. Round ligament pain   2. Back pain in pregnancy   3. [redacted] weeks gestation of pregnancy    DC home Comfort measures reviewed  3rd Trimester precautions  PTL precautions  Fetal kick counts RX: flexeril PRN #[id number]  Return to MAU as needed FU with OB as planned  Follow-up Information    [facility] Follow up.   Contact information: [address] [address] Casas Adobes DNP, CNM  11/24/18  9:15 PM

## 2018-11-24 NOTE — MAU Note (Signed)
[name] is a 33 y.o. at [redacted]w[redacted]d here in MAU reporting: right sided lower abdominal and back pain since this afternoon, has not tried any pain medication. No bleeding or LOF. Has felt some movement but states it is less than normal  Onset of complaint: today  Pain score: abdomen 7/10, back 10/10  Vitals:   11/24/18 1759  BP: 113/61  Pulse: 79  Resp: 16  Temp: 98.1 F (36.7 C)  SpO2: 99%     FHT: 138  Lab orders placed from triage: UA

## 2018-11-24 NOTE — Discharge Instructions (Signed)
Third Trimester of Pregnancy The third trimester is from week 28 through week 40 (months 7 through 9). The third trimester is a time when the unborn baby (fetus) is growing rapidly. At the end of the ninth month, the fetus is about 20 inches in length and weighs 6-10 pounds. Body changes during your third trimester Your body will continue to go through many changes during pregnancy. The changes vary from woman to woman. During the third trimester:  Your weight will continue to increase. You can expect to gain 25-35 pounds (11-16 kg) by the end of the pregnancy.  You may begin to get stretch marks on your hips, abdomen, and breasts.  You may urinate more often because the fetus is moving lower into your pelvis and pressing on your bladder.  You may develop or continue to have heartburn. This is caused by increased hormones that slow down muscles in the digestive tract.  You may develop or continue to have constipation because increased hormones slow digestion and cause the muscles that push waste through your intestines to relax.  You may develop hemorrhoids. These are swollen veins (varicose veins) in the rectum that can itch or be painful.  You may develop swollen, bulging veins (varicose veins) in your legs.  You may have increased body aches in the pelvis, back, or thighs. This is due to weight gain and increased hormones that are relaxing your joints.  You may have changes in your hair. These can include thickening of your hair, rapid growth, and changes in texture. Some women also have hair loss during or after pregnancy, or hair that feels dry or thin. Your hair will most likely return to normal after your baby is born.  Your breasts will continue to grow and they will continue to become tender. A yellow fluid (colostrum) may leak from your breasts. This is the first milk you are producing for your baby.  Your belly button may stick out.  You may notice more swelling in your hands,  face, or ankles.  You may have increased tingling or numbness in your hands, arms, and legs. The skin on your belly may also feel numb.  You may feel short of breath because of your expanding uterus.  You may have more problems sleeping. This can be caused by the size of your belly, increased need to urinate, and an increase in your body's metabolism.  You may notice the fetus "dropping," or moving lower in your abdomen (lightening).  You may have increased vaginal discharge.  You may notice your joints feel loose and you may have pain around your pelvic bone. What to expect at prenatal visits You will have prenatal exams every 2 weeks until week 36. Then you will have weekly prenatal exams. During a routine prenatal visit:  You will be weighed to make sure you and the baby are growing normally.  Your blood pressure will be taken.  Your abdomen will be measured to track your baby's growth.  The fetal heartbeat will be listened to.  Any test results from the previous visit will be discussed.  You may have a cervical check near your due date to see if your cervix has softened or thinned (effaced).  You will be tested for Group B streptococcus. This happens between 35 and 37 weeks. Your health care provider may ask you:  What your birth plan is.  How you are feeling.  If you are feeling the baby move.  If you have had any abnormal   symptoms, such as leaking fluid, bleeding, severe headaches, or abdominal cramping.  If you are using any tobacco products, including cigarettes, chewing tobacco, and electronic cigarettes.  If you have any questions. Other tests or screenings that may be performed during your third trimester include:  Blood tests that check for low iron levels (anemia).  Fetal testing to check the health, activity level, and growth of the fetus. Testing is done if you have certain medical conditions or if there are problems during the pregnancy.  Nonstress test  (NST). This test checks the health of your baby to make sure there are no signs of problems, such as the baby not getting enough oxygen. During this test, a belt is placed around your belly. The baby is made to move, and its heart rate is monitored during movement. What is false labor? False labor is a condition in which you feel small, irregular tightenings of the muscles in the womb (contractions) that usually go away with rest, changing position, or drinking water. These are called Braxton Hicks contractions. Contractions may last for hours, days, or even weeks before true labor sets in. If contractions come at regular intervals, become more frequent, increase in intensity, or become painful, you should see your health care provider. What are the signs of labor?  Abdominal cramps.  Regular contractions that start at 10 minutes apart and become stronger and more frequent with time.  Contractions that start on the top of the uterus and spread down to the lower abdomen and back.  Increased pelvic pressure and dull back pain.  A watery or bloody mucus discharge that comes from the vagina.  Leaking of amniotic fluid. This is also known as your "water breaking." It could be a slow trickle or a gush. Let your health care provider know if it has a color or strange odor. If you have any of these signs, call your health care provider right away, even if it is before your due date. Follow these instructions at home: Medicines  Follow your health care provider's instructions regarding medicine use. Specific medicines may be either safe or unsafe to take during pregnancy.  Take a prenatal vitamin that contains at least 600 micrograms (mcg) of folic acid.  If you develop constipation, try taking a stool softener if your health care provider approves. Eating and drinking   Eat a balanced diet that includes fresh fruits and vegetables, whole grains, good sources of protein such as meat, eggs, or tofu,  and low-fat dairy. Your health care provider will help you determine the amount of weight gain that is right for you.  Avoid raw meat and uncooked cheese. These carry germs that can cause birth defects in the baby.  If you have low calcium intake from food, talk to your health care provider about whether you should take a daily calcium supplement.  Eat four or five small meals rather than three large meals a day.  Limit foods that are high in fat and processed sugars, such as fried and sweet foods.  To prevent constipation: ? Drink enough fluid to keep your urine clear or pale yellow. ? Eat foods that are high in fiber, such as fresh fruits and vegetables, whole grains, and beans. Activity  Exercise only as directed by your health care provider. Most women can continue their usual exercise routine during pregnancy. Try to exercise for 30 minutes at least 5 days a week. Stop exercising if you experience uterine contractions.  Avoid heavy lifting.  Do   not exercise in extreme heat or humidity, or at high altitudes.  Wear low-heel, comfortable shoes.  Practice good posture.  You may continue to have sex unless your health care provider tells you otherwise. Relieving pain and discomfort  Take frequent breaks and rest with your legs elevated if you have leg cramps or low back pain.  Take warm sitz baths to soothe any pain or discomfort caused by hemorrhoids. Use hemorrhoid cream if your health care provider approves.  Wear a good support bra to prevent discomfort from breast tenderness.  If you develop varicose veins: ? Wear support pantyhose or compression stockings as told by your healthcare provider. ? Elevate your feet for 15 minutes, 3-4 times a day. Prenatal care  Write down your questions. Take them to your prenatal visits.  Keep all your prenatal visits as told by your health care provider. This is important. Safety  Wear your seat belt at all times when driving.  Make  a list of emergency phone numbers, including numbers for family, friends, the hospital, and police and fire departments. General instructions  Avoid cat litter boxes and soil used by cats. These carry germs that can cause birth defects in the baby. If you have a cat, ask someone to clean the litter box for you.  Do not travel far distances unless it is absolutely necessary and only with the approval of your health care provider.  Do not use hot tubs, steam rooms, or saunas.  Do not drink alcohol.  Do not use any products that contain nicotine or tobacco, such as cigarettes and e-cigarettes. If you need help quitting, ask your health care provider.  Do not use any medicinal herbs or unprescribed drugs. These chemicals affect the formation and growth of the baby.  Do not douche or use tampons or scented sanitary pads.  Do not cross your legs for long periods of time.  To prepare for the arrival of your baby: ? Take prenatal classes to understand, practice, and ask questions about labor and delivery. ? Make a trial run to the hospital. ? Visit the hospital and tour the maternity area. ? Arrange for maternity or paternity leave through employers. ? Arrange for family and friends to take care of pets while you are in the hospital. ? Purchase a rear-facing car seat and make sure you know how to install it in your car. ? Pack your hospital bag. ? Prepare the baby's nursery. Make sure to remove all pillows and stuffed animals from the baby's crib to prevent suffocation.  Visit your dentist if you have not gone during your pregnancy. Use a soft toothbrush to brush your teeth and be gentle when you floss. Contact a health care provider if:  You are unsure if you are in labor or if your water has broken.  You become dizzy.  You have mild pelvic cramps, pelvic pressure, or nagging pain in your abdominal area.  You have lower back pain.  You have persistent nausea, vomiting, or diarrhea.   You have an unusual or bad smelling vaginal discharge.  You have pain when you urinate. Get help right away if:  Your water breaks before 37 weeks.  You have regular contractions less than 5 minutes apart before 37 weeks.  You have a fever.  You are leaking fluid from your vagina.  You have spotting or bleeding from your vagina.  You have severe abdominal pain or cramping.  You have rapid weight loss or weight gain.  You have   shortness of breath with chest pain.  You notice sudden or extreme swelling of your face, hands, ankles, feet, or legs.  Your baby makes fewer than 10 movements in 2 hours.  You have severe headaches that do not go away when you take medicine.  You have vision changes. Summary  The third trimester is from week 28 through week 40, months 7 through 9. The third trimester is a time when the unborn baby (fetus) is growing rapidly.  During the third trimester, your discomfort may increase as you and your baby continue to gain weight. You may have abdominal, leg, and back pain, sleeping problems, and an increased need to urinate.  During the third trimester your breasts will keep growing and they will continue to become tender. A yellow fluid (colostrum) may leak from your breasts. This is the first milk you are producing for your baby.  False labor is a condition in which you feel small, irregular tightenings of the muscles in the womb (contractions) that eventually go away. These are called Braxton Hicks contractions. Contractions may last for hours, days, or even weeks before true labor sets in.  Signs of labor can include: abdominal cramps; regular contractions that start at 10 minutes apart and become stronger and more frequent with time; watery or bloody mucus discharge that comes from the vagina; increased pelvic pressure and dull back pain; and leaking of amniotic fluid. This information is not intended to replace advice given to you by your health  care provider. Make sure you discuss any questions you have with your health care provider. Document Released: 02/22/2001 Document Revised: 06/21/2018 Document Reviewed: 04/05/2016 Elsevier Patient Education  2020 [employer]  

## 2018-11-24 NOTE — Progress Notes (Signed)
Received call from RN line that patient called after hours line saying she has significant abdominal pain, rating it 7/10. RN told her to present to MAU if she is having that much pain, then per RN, patient did not want to come and states her pain is not bad. RN advised patient to present to MAU with pain and patient said her sister would take her to MAU. MAU staff aware.   Feliz Beam, M.D. Attending Center for [employer])

## 2018-11-26 LAB — CULTURE, OB URINE: Culture: 10000 — AB

## 2018-11-27 ENCOUNTER — Ambulatory Visit (INDEPENDENT_AMBULATORY_CARE_PROVIDER_SITE_OTHER): Payer: Medicaid Other | Admitting: Obstetrics and Gynecology

## 2018-11-27 ENCOUNTER — Encounter: Payer: Self-pay | Admitting: Obstetrics and Gynecology

## 2018-11-27 ENCOUNTER — Other Ambulatory Visit: Payer: Self-pay

## 2018-11-27 VITALS — BP 105/70 | HR 83 | Wt 200.1 lb

## 2018-11-27 DIAGNOSIS — Z98891 History of uterine scar from previous surgery: Secondary | ICD-10-CM

## 2018-11-27 DIAGNOSIS — Z3A30 30 weeks gestation of pregnancy: Secondary | ICD-10-CM

## 2018-11-27 DIAGNOSIS — O0993 Supervision of high risk pregnancy, unspecified, third trimester: Secondary | ICD-10-CM

## 2018-11-27 DIAGNOSIS — O0990 Supervision of high risk pregnancy, unspecified, unspecified trimester: Secondary | ICD-10-CM

## 2018-11-27 DIAGNOSIS — Z3009 Encounter for other general counseling and advice on contraception: Secondary | ICD-10-CM | POA: Insufficient documentation

## 2018-11-27 DIAGNOSIS — O4402 Placenta previa specified as without hemorrhage, second trimester: Secondary | ICD-10-CM

## 2018-11-27 DIAGNOSIS — O4403 Placenta previa specified as without hemorrhage, third trimester: Secondary | ICD-10-CM

## 2018-11-27 DIAGNOSIS — O34219 Maternal care for unspecified type scar from previous cesarean delivery: Secondary | ICD-10-CM

## 2018-11-27 NOTE — Progress Notes (Signed)
   PRENATAL VISIT NOTE  Subjective:  [name] is a 33 y.o. [phone] at [redacted]w[redacted]d being seen today for ongoing prenatal care.  She is currently monitored for the following issues for this high-risk pregnancy and has PAP SMEAR, ABNORMAL; S/P cesarean section; History of postpartum depression, currently pregnant; Supervision of high risk pregnancy, antepartum; Placenta previa antepartum in second trimester; Marijuana use; and Unwanted fertility on their problem list.  Patient reports no complaints.  Contractions: Irritability. Vag. Bleeding: None.  Movement: Present. Denies leaking of fluid.   The following portions of the patient's history were reviewed and updated as appropriate: allergies, current medications, past family history, past medical history, past social history, past surgical history and problem list.   Objective:   Vitals:   11/27/18 1000  BP: 105/70  Pulse: 83  Weight: 200 lb 1.6 oz (90.8 kg)    Fetal Status: Fetal Heart Rate (bpm): 155   Movement: Present     General:  Alert, oriented and cooperative. Patient is in no acute distress.  Skin: Skin is warm and dry. No rash noted.   Cardiovascular: Normal heart rate noted  Respiratory: Normal respiratory effort, no problems with respiration noted  Abdomen: Soft, gravid, appropriate for gestational age.  Pain/Pressure: Present     Pelvic: Cervical exam deferred        Extremities: Normal range of motion.  Edema: Trace  Mental Status: Normal mood and affect. Normal behavior. Normal judgment and thought content.   Assessment and Plan:  Pregnancy: G4P1021 at [redacted]w[redacted]d  1. Supervision of high risk pregnancy, antepartum  2. S/P cesarean section For RCS, scheduled today for 37 weeks  3. Placenta previa antepartum in second trimester F/u US 12/18/18  4. Unwanted fertility BTL papers signed 11/06/18  Preterm labor symptoms and general obstetric precautions including but not limited to vaginal bleeding, contractions,  leaking of fluid and fetal movement were reviewed in detail with the patient. Please refer to After Visit Summary for other counseling recommendations.   Return in about 2 weeks (around 12/11/2018) for high OB, virtual.  Future Appointments  Date Time Provider [place]  12/18/2018 11:30 AM [place] [facility]  12/18/2018 11:30 AM WH-MFC [place] 5 WH-MFCUS MFC-US    Sloan Leiter, MD

## 2018-11-27 NOTE — Progress Notes (Signed)
Pt is here for ROB. 106w6d.

## 2018-11-28 ENCOUNTER — Other Ambulatory Visit: Payer: Self-pay | Admitting: Family Medicine

## 2018-11-28 DIAGNOSIS — Z98891 History of uterine scar from previous surgery: Secondary | ICD-10-CM

## 2018-12-11 ENCOUNTER — Encounter: Payer: Self-pay | Admitting: Obstetrics and Gynecology

## 2018-12-11 ENCOUNTER — Ambulatory Visit (INDEPENDENT_AMBULATORY_CARE_PROVIDER_SITE_OTHER): Payer: Medicaid Other | Admitting: Obstetrics and Gynecology

## 2018-12-11 DIAGNOSIS — Z3A32 32 weeks gestation of pregnancy: Secondary | ICD-10-CM

## 2018-12-11 DIAGNOSIS — O4403 Placenta previa specified as without hemorrhage, third trimester: Secondary | ICD-10-CM

## 2018-12-11 DIAGNOSIS — Z3009 Encounter for other general counseling and advice on contraception: Secondary | ICD-10-CM

## 2018-12-11 DIAGNOSIS — O0993 Supervision of high risk pregnancy, unspecified, third trimester: Secondary | ICD-10-CM

## 2018-12-11 DIAGNOSIS — O0990 Supervision of high risk pregnancy, unspecified, unspecified trimester: Secondary | ICD-10-CM

## 2018-12-11 DIAGNOSIS — O4402 Placenta previa specified as without hemorrhage, second trimester: Secondary | ICD-10-CM

## 2018-12-11 MED ORDER — BLOOD PRESSURE KIT DEVI: 1.0000 | 0 refills | Status: AC | PRN

## 2018-12-11 NOTE — Progress Notes (Signed)
I connected with [place] on 12/11/18 at  9:15 AM EDT by telephone and verified that I am speaking with the correct person using two identifiers.  Pt does not have BP.

## 2018-12-11 NOTE — Progress Notes (Signed)
[facility] VIRTUAL VIDEO VISIT ENCOUNTER NOTE  Provider location: Center for [facility] at [place]   I connected with [name] on 12/11/18 at  9:15 AM EDT by WebEx Encounter at home and verified that I am speaking with the correct person using two identifiers.   I discussed the limitations, risks, security and privacy concerns of performing an evaluation and management service virtually and the availability of in person appointments. I also discussed with the patient that there may be a patient responsible charge related to this service. The patient expressed understanding and agreed to proceed. Subjective:  [name] is a 33 y.o. [phone] at [redacted]w[redacted]d being seen today for ongoing prenatal care.  She is currently monitored for the following issues for this high-risk pregnancy and has PAP SMEAR, ABNORMAL; S/P cesarean section; History of postpartum depression, currently pregnant; Supervision of high risk pregnancy, antepartum; Placenta previa antepartum in second trimester; Marijuana use; and Unwanted fertility on their problem list.  Patient reports no complaints.  Contractions: Irritability. Vag. Bleeding: None.  Movement: Present. Denies any leaking of fluid.   The following portions of the patient's history were reviewed and updated as appropriate: allergies, current medications, past family history, past medical history, past social history, past surgical history and problem list.   Objective:  There were no vitals filed for this visit.  Fetal Status:     Movement: Present     General:  Alert, oriented and cooperative. Patient is in no acute distress.  Respiratory: Normal respiratory effort, no problems with respiration noted  Mental Status: Normal mood and affect. Normal behavior. Normal judgment and thought content.  Rest of physical exam deferred due to type of encounter  Imaging: [place] Mfm Ob Limited  Result Date: 11/25/2018  ----------------------------------------------------------------------  OBSTETRICS REPORT                        (Signed Final 11/25/2018 11:44 am) ---------------------------------------------------------------------- Patient Info  ID #:       [id number]                          D.O.B.:  [birth year] (33 yrs)  Name:       Wendy Bern                  Visit Date: 11/24/2018 08:35 pm              Oconnor ---------------------------------------------------------------------- Performed By  Performed By:     Freda Munro Driskill        Ref. Address:      [address], [place]  [address]  Attending:        Noralee Space MD        [facility]  Referred By:      [facility]-                    Faculty Physician ---------------------------------------------------------------------- Orders   #  Description                          Code         Ordered By   1  [place] MFM OB LIMITED                    09323.55     Thressa Sheller  ----------------------------------------------------------------------   #  Order #                    Accession #                 Episode #   1  [id number]                  [id number]                  [id number]  ---------------------------------------------------------------------- Indications   [redacted] weeks gestation of pregnancy                Z3A.30   Previous cesarean delivery, antepartum         O34.219   Placenta previa specified as without           O44.02   hemorrhage, second trimester   Abdominal pain in pregnancy                    O99.89  ---------------------------------------------------------------------- Fetal Evaluation  Num Of Fetuses:          1  Fetal  Heart Rate(bpm):   137  Cardiac Activity:        Observed  Presentation:            Cephalic  Placenta:                Posterior Previa  Amniotic Fluid  AFI FV:      Within normal limits  AFI Sum(cm)     %Tile       Largest Pocket(cm)  19.47           75          5.68  RUQ(cm)       RLQ(cm)       LUQ(cm)        LLQ(cm)  4.84          4.33          5.68           4.62 ---------------------------------------------------------------------- OB History  Gravidity:    4         Term:  1        Prem:   0        SAB:   0  TOP:          1       Ectopic:  1        Living: 1 ---------------------------------------------------------------------- Gestational Age  LMP:           30w 3d        Date:  04/25/18                 EDD:   01/30/19  Best:          Georgiann Hahn30w 3d     Det. By:  LMP  (04/25/18)          EDD:   01/30/19 ---------------------------------------------------------------------- Impression  Patient with known placenta previa was evaluated at the [facility]U  for c/o low back pain. No history of vaginal bleeding.  A limited ultrasound study was performed. Amniotic fluid is  normal and good fetal activity is seen. Placenta previa is  seen. ----------------------------------------------------------------------                  Noralee Spaceavi Shankar, MD Electronically Signed Final Report   11/25/2018 11:44 am ----------------------------------------------------------------------   Assessment and Plan:  Pregnancy: [id number] at 1641w6d 1. Supervision of high risk pregnancy, antepartum Stable  2. Placenta previa antepartum in second trimester U/S next week C section 01/09/19  3. Unwanted fertility Papers signed  Preterm labor symptoms and general obstetric precautions including but not limited to vaginal bleeding, contractions, leaking of fluid and fetal movement were reviewed in detail with the patient. I discussed the assessment and treatment plan with the patient. The patient was provided an opportunity to ask questions and all  were answered. The patient agreed with the plan and demonstrated an understanding of the instructions. The patient was advised to call back or seek an in-person office evaluation/[facility] for any urgent or concerning symptoms. Please refer to After Visit Summary for other counseling recommendations.   I provided 8 minutes of face-to-face time during this encounter.  Return in about 2 weeks (around 12/25/2018) for OB visit, face to face.  Future Appointments  Date Time Provider Department Center  12/18/2018 11:30 AM [facility]-MFC NURSE WH-MFC MFC-US  12/18/2018 11:30 AM WH-MFC US 5 WH-MFCUS MFC-US    Hermina StaggersMichael L Tehani Mersman, MD [facility]

## 2018-12-11 NOTE — Patient Instructions (Signed)
Third Trimester of Pregnancy The third trimester is from week 28 through week 40 (months 7 through 9). The third trimester is a time when the unborn baby (fetus) is growing rapidly. At the end of the ninth month, the fetus is about 20 inches in length and weighs 6-10 pounds. Body changes during your third trimester Your body will continue to go through many changes during pregnancy. The changes vary from woman to woman. During the third trimester:  Your weight will continue to increase. You can expect to gain 25-35 pounds (11-16 kg) by the end of the pregnancy.  You may begin to get stretch marks on your hips, abdomen, and breasts.  You may urinate more often because the fetus is moving lower into your pelvis and pressing on your bladder.  You may develop or continue to have heartburn. This is caused by increased hormones that slow down muscles in the digestive tract.  You may develop or continue to have constipation because increased hormones slow digestion and cause the muscles that push waste through your intestines to relax.  You may develop hemorrhoids. These are swollen veins (varicose veins) in the rectum that can itch or be painful.  You may develop swollen, bulging veins (varicose veins) in your legs.  You may have increased body aches in the pelvis, back, or thighs. This is due to weight gain and increased hormones that are relaxing your joints.  You may have changes in your hair. These can include thickening of your hair, rapid growth, and changes in texture. Some women also have hair loss during or after pregnancy, or hair that feels dry or thin. Your hair will most likely return to normal after your baby is born.  Your breasts will continue to grow and they will continue to become tender. A yellow fluid (colostrum) may leak from your breasts. This is the first milk you are producing for your baby.  Your belly button may stick out.  You may notice more swelling in your hands,  face, or ankles.  You may have increased tingling or numbness in your hands, arms, and legs. The skin on your belly may also feel numb.  You may feel short of breath because of your expanding uterus.  You may have more problems sleeping. This can be caused by the size of your belly, increased need to urinate, and an increase in your body's metabolism.  You may notice the fetus "dropping," or moving lower in your abdomen (lightening).  You may have increased vaginal discharge.  You may notice your joints feel loose and you may have pain around your pelvic bone. What to expect at prenatal visits You will have prenatal exams every 2 weeks until week 36. Then you will have weekly prenatal exams. During a routine prenatal visit:  You will be weighed to make sure you and the baby are growing normally.  Your blood pressure will be taken.  Your abdomen will be measured to track your baby's growth.  The fetal heartbeat will be listened to.  Any test results from the previous visit will be discussed.  You may have a cervical check near your due date to see if your cervix has softened or thinned (effaced).  You will be tested for Group B streptococcus. This happens between 35 and 37 weeks. Your health care provider may ask you:  What your birth plan is.  How you are feeling.  If you are feeling the baby move.  If you have had any abnormal   symptoms, such as leaking fluid, bleeding, severe headaches, or abdominal cramping.  If you are using any tobacco products, including cigarettes, chewing tobacco, and electronic cigarettes.  If you have any questions. Other tests or screenings that may be performed during your third trimester include:  Blood tests that check for low iron levels (anemia).  Fetal testing to check the health, activity level, and growth of the fetus. Testing is done if you have certain medical conditions or if there are problems during the pregnancy.  Nonstress test  (NST). This test checks the health of your baby to make sure there are no signs of problems, such as the baby not getting enough oxygen. During this test, a belt is placed around your belly. The baby is made to move, and its heart rate is monitored during movement. What is false labor? False labor is a condition in which you feel small, irregular tightenings of the muscles in the womb (contractions) that usually go away with rest, changing position, or drinking water. These are called Braxton Hicks contractions. Contractions may last for hours, days, or even weeks before true labor sets in. If contractions come at regular intervals, become more frequent, increase in intensity, or become painful, you should see your health care provider. What are the signs of labor?  Abdominal cramps.  Regular contractions that start at 10 minutes apart and become stronger and more frequent with time.  Contractions that start on the top of the uterus and spread down to the lower abdomen and back.  Increased pelvic pressure and dull back pain.  A watery or bloody mucus discharge that comes from the vagina.  Leaking of amniotic fluid. This is also known as your "water breaking." It could be a slow trickle or a gush. Let your health care provider know if it has a color or strange odor. If you have any of these signs, call your health care provider right away, even if it is before your due date. Follow these instructions at home: Medicines  Follow your health care provider's instructions regarding medicine use. Specific medicines may be either safe or unsafe to take during pregnancy.  Take a prenatal vitamin that contains at least 600 micrograms (mcg) of folic acid.  If you develop constipation, try taking a stool softener if your health care provider approves. Eating and drinking   Eat a balanced diet that includes fresh fruits and vegetables, whole grains, good sources of protein such as meat, eggs, or tofu,  and low-fat dairy. Your health care provider will help you determine the amount of weight gain that is right for you.  Avoid raw meat and uncooked cheese. These carry germs that can cause birth defects in the baby.  If you have low calcium intake from food, talk to your health care provider about whether you should take a daily calcium supplement.  Eat four or five small meals rather than three large meals a day.  Limit foods that are high in fat and processed sugars, such as fried and sweet foods.  To prevent constipation: ? Drink enough fluid to keep your urine clear or pale yellow. ? Eat foods that are high in fiber, such as fresh fruits and vegetables, whole grains, and beans. Activity  Exercise only as directed by your health care provider. Most women can continue their usual exercise routine during pregnancy. Try to exercise for 30 minutes at least 5 days a week. Stop exercising if you experience uterine contractions.  Avoid heavy lifting.  Do   not exercise in extreme heat or humidity, or at high altitudes.  Wear low-heel, comfortable shoes.  Practice good posture.  You may continue to have sex unless your health care provider tells you otherwise. Relieving pain and discomfort  Take frequent breaks and rest with your legs elevated if you have leg cramps or low back pain.  Take warm sitz baths to soothe any pain or discomfort caused by hemorrhoids. Use hemorrhoid cream if your health care provider approves.  Wear a good support bra to prevent discomfort from breast tenderness.  If you develop varicose veins: ? Wear support pantyhose or compression stockings as told by your healthcare provider. ? Elevate your feet for 15 minutes, 3-4 times a day. Prenatal care  Write down your questions. Take them to your prenatal visits.  Keep all your prenatal visits as told by your health care provider. This is important. Safety  Wear your seat belt at all times when driving.  Make  a list of emergency phone numbers, including numbers for family, friends, the hospital, and police and fire departments. General instructions  Avoid cat litter boxes and soil used by cats. These carry germs that can cause birth defects in the baby. If you have a cat, ask someone to clean the litter box for you.  Do not travel far distances unless it is absolutely necessary and only with the approval of your health care provider.  Do not use hot tubs, steam rooms, or saunas.  Do not drink alcohol.  Do not use any products that contain nicotine or tobacco, such as cigarettes and e-cigarettes. If you need help quitting, ask your health care provider.  Do not use any medicinal herbs or unprescribed drugs. These chemicals affect the formation and growth of the baby.  Do not douche or use tampons or scented sanitary pads.  Do not cross your legs for long periods of time.  To prepare for the arrival of your baby: ? Take prenatal classes to understand, practice, and ask questions about labor and delivery. ? Make a trial run to the hospital. ? Visit the hospital and tour the maternity area. ? Arrange for maternity or paternity leave through employers. ? Arrange for family and friends to take care of pets while you are in the hospital. ? Purchase a rear-facing car seat and make sure you know how to install it in your car. ? Pack your hospital bag. ? Prepare the baby's nursery. Make sure to remove all pillows and stuffed animals from the baby's crib to prevent suffocation.  Visit your dentist if you have not gone during your pregnancy. Use a soft toothbrush to brush your teeth and be gentle when you floss. Contact a health care provider if:  You are unsure if you are in labor or if your water has broken.  You become dizzy.  You have mild pelvic cramps, pelvic pressure, or nagging pain in your abdominal area.  You have lower back pain.  You have persistent nausea, vomiting, or diarrhea.   You have an unusual or bad smelling vaginal discharge.  You have pain when you urinate. Get help right away if:  Your water breaks before 37 weeks.  You have regular contractions less than 5 minutes apart before 37 weeks.  You have a fever.  You are leaking fluid from your vagina.  You have spotting or bleeding from your vagina.  You have severe abdominal pain or cramping.  You have rapid weight loss or weight gain.  You have   shortness of breath with chest pain.  You notice sudden or extreme swelling of your face, hands, ankles, feet, or legs.  Your baby makes fewer than 10 movements in 2 hours.  You have severe headaches that do not go away when you take medicine.  You have vision changes. Summary  The third trimester is from week 28 through week 40, months 7 through 9. The third trimester is a time when the unborn baby (fetus) is growing rapidly.  During the third trimester, your discomfort may increase as you and your baby continue to gain weight. You may have abdominal, leg, and back pain, sleeping problems, and an increased need to urinate.  During the third trimester your breasts will keep growing and they will continue to become tender. A yellow fluid (colostrum) may leak from your breasts. This is the first milk you are producing for your baby.  False labor is a condition in which you feel small, irregular tightenings of the muscles in the womb (contractions) that eventually go away. These are called Braxton Hicks contractions. Contractions may last for hours, days, or even weeks before true labor sets in.  Signs of labor can include: abdominal cramps; regular contractions that start at 10 minutes apart and become stronger and more frequent with time; watery or bloody mucus discharge that comes from the vagina; increased pelvic pressure and dull back pain; and leaking of amniotic fluid. This information is not intended to replace advice given to you by your health  care provider. Make sure you discuss any questions you have with your health care provider. Document Released: 02/22/2001 Document Revised: 06/21/2018 Document Reviewed: 04/05/2016 Elsevier Patient Education  2020 [employer]  

## 2018-12-18 ENCOUNTER — Other Ambulatory Visit: Payer: Self-pay

## 2018-12-18 ENCOUNTER — Encounter (HOSPITAL_COMMUNITY): Payer: Self-pay

## 2018-12-18 ENCOUNTER — Ambulatory Visit (HOSPITAL_COMMUNITY): Payer: Medicaid Other | Admitting: *Deleted

## 2018-12-18 ENCOUNTER — Ambulatory Visit (HOSPITAL_COMMUNITY)
Admission: RE | Admit: 2018-12-18 | Discharge: 2018-12-18 | Disposition: A | Discharge status: Home / Self Care | Payer: Medicaid Other | Source: Ambulatory Visit | Attending: Obstetrics and Gynecology | Admitting: Obstetrics and Gynecology

## 2018-12-18 DIAGNOSIS — Z3A33 33 weeks gestation of pregnancy: Secondary | ICD-10-CM | POA: Diagnosis not present

## 2018-12-18 DIAGNOSIS — O0990 Supervision of high risk pregnancy, unspecified, unspecified trimester: Secondary | ICD-10-CM | POA: Diagnosis present

## 2018-12-18 DIAGNOSIS — Z362 Encounter for other antenatal screening follow-up: Secondary | ICD-10-CM

## 2018-12-18 DIAGNOSIS — O34219 Maternal care for unspecified type scar from previous cesarean delivery: Secondary | ICD-10-CM

## 2018-12-18 DIAGNOSIS — O4402 Placenta previa specified as without hemorrhage, second trimester: Secondary | ICD-10-CM

## 2018-12-24 ENCOUNTER — Encounter (HOSPITAL_COMMUNITY): Payer: Self-pay

## 2018-12-24 NOTE — Patient Instructions (Signed)
[place]  12/24/2018   Your procedure is scheduled on:  01/09/2019  Arrive at 0730 at Entrance C on [address] at [facility]  and [employer] [employer] [address]. You are invited to use the FREE valet parking or use the Visitor's parking deck.  Pick up the phone at the desk and dial [phone].  Call this number if you have problems the morning of surgery: [phone]  Remember:   Do not eat food:(After Midnight) Desps de medianoche.  Do not drink clear liquids: (After Midnight) Desps de medianoche.  Take these medicines the morning of surgery with A SIP OF WATER:  none   Do not wear jewelry, make-up or nail polish.  Do not wear lotions, powders, or perfumes. Do not wear deodorant.  Do not shave 48 hours prior to surgery.  Do not bring valuables to the hospital.  [facility] is not   responsible for any belongings or valuables brought to the hospital.  Contacts, dentures or bridgework may not be worn into surgery.  Leave suitcase in the car. After surgery it may be brought to your room.  For patients admitted to the hospital, checkout time is 11:00 AM the day of              discharge.      Please read over the following fact sheets that you were given:     Preparing for Surgery

## 2018-12-25 ENCOUNTER — Other Ambulatory Visit: Payer: Self-pay

## 2018-12-25 ENCOUNTER — Ambulatory Visit (INDEPENDENT_AMBULATORY_CARE_PROVIDER_SITE_OTHER): Payer: Medicaid Other | Admitting: Obstetrics and Gynecology

## 2018-12-25 ENCOUNTER — Encounter: Payer: Self-pay | Admitting: Obstetrics and Gynecology

## 2018-12-25 VITALS — BP 110/74 | HR 96 | Wt 212.5 lb

## 2018-12-25 DIAGNOSIS — O0993 Supervision of high risk pregnancy, unspecified, third trimester: Secondary | ICD-10-CM

## 2018-12-25 DIAGNOSIS — O4403 Placenta previa specified as without hemorrhage, third trimester: Secondary | ICD-10-CM

## 2018-12-25 DIAGNOSIS — Z98891 History of uterine scar from previous surgery: Secondary | ICD-10-CM

## 2018-12-25 DIAGNOSIS — Z3A34 34 weeks gestation of pregnancy: Secondary | ICD-10-CM

## 2018-12-25 DIAGNOSIS — O0990 Supervision of high risk pregnancy, unspecified, unspecified trimester: Secondary | ICD-10-CM

## 2018-12-25 DIAGNOSIS — O4402 Placenta previa specified as without hemorrhage, second trimester: Secondary | ICD-10-CM

## 2018-12-25 DIAGNOSIS — Z3009 Encounter for other general counseling and advice on contraception: Secondary | ICD-10-CM

## 2018-12-25 NOTE — Progress Notes (Signed)
   PRENATAL VISIT NOTE  Subjective:  [name] is a 33 y.o. [phone] at [redacted]w[redacted]d being seen today for ongoing prenatal care.  She is currently monitored for the following issues for this high-risk pregnancy and has PAP SMEAR, ABNORMAL; S/P cesarean section; History of postpartum depression, currently pregnant; Supervision of high risk pregnancy, antepartum; Placenta previa antepartum in second trimester; Marijuana use; and Unwanted fertility on their problem list.  Patient reports generalized pruritis.  Contractions: Not present. Vag. Bleeding: None.  Movement: Present. Denies leaking of fluid.   The following portions of the patient's history were reviewed and updated as appropriate: allergies, current medications, past family history, past medical history, past social history, past surgical history and problem list.   Objective:   Vitals:   12/25/18 0816  BP: 110/74  Pulse: 96  Weight: 212 lb 8 oz (96.4 kg)    Fetal Status: Fetal Heart Rate (bpm): 135 Fundal Height: 36 cm Movement: Present     General:  Alert, oriented and cooperative. Patient is in no acute distress.  Skin: Skin is warm and dry. No rash noted.   Cardiovascular: Normal heart rate noted  Respiratory: Normal respiratory effort, no problems with respiration noted  Abdomen: Soft, gravid, appropriate for gestational age.  Pain/Pressure: Present     Pelvic: Cervical exam deferred        Extremities: Normal range of motion.  Edema: Trace  Mental Status: Normal mood and affect. Normal behavior. Normal judgment and thought content.   Assessment and Plan:  Pregnancy: G4P1021 at [redacted]w[redacted]d 1. Supervision of high risk pregnancy, antepartum Patient is doing well Bile acids ordered to rule out cholestasis of pregnancy Benadryl and hydrocortisone cream for symptomatic relief - Bile acids, total  2. S/P cesarean section Patient scheduled for repeat with BTL at 37 weeks due to posterior previa  3. Placenta previa  antepartum in second trimester   4. Unwanted fertility   Preterm labor symptoms and general obstetric precautions including but not limited to vaginal bleeding, contractions, leaking of fluid and fetal movement were reviewed in detail with the patient. Please refer to After Visit Summary for other counseling recommendations.   Return in about 1 week (around 01/01/2019) for in person, ROB, High risk.  Future Appointments  Date Time Provider [place]  01/07/2019  8:50 AM [facility] 1 [facility] None    Mora Bellman, MD

## 2018-12-25 NOTE — Progress Notes (Signed)
Pt is here for ROB. [redacted]w[redacted]d.

## 2018-12-26 ENCOUNTER — Telehealth: Payer: Self-pay

## 2018-12-26 ENCOUNTER — Other Ambulatory Visit: Payer: Self-pay | Admitting: Obstetrics

## 2018-12-26 DIAGNOSIS — L299 Pruritus, unspecified: Secondary | ICD-10-CM

## 2018-12-26 MED ORDER — URSODIOL 300 MG PO CAPS: 300.0000 mg | ORAL_CAPSULE | Freq: Two times a day (BID) | ORAL | 4 refills | Status: DC

## 2018-12-26 NOTE — Telephone Encounter (Signed)
Patient called complaining of having severe itching all over her body. She states that she has been using benadryl and hydrocortisone cream with no relief. She is unable to take the Benadryl during the day to having to work. Pt states that she is starting to bleed from scratching and whelp up. Discussed with Dr. Jodi Mourning. He sent rx for Actigall to pharmacy for pt to take twice a day. Patient verbalized understanding

## 2018-12-27 ENCOUNTER — Other Ambulatory Visit: Payer: Self-pay | Admitting: Obstetrics and Gynecology

## 2018-12-27 ENCOUNTER — Telehealth: Payer: Self-pay

## 2018-12-27 DIAGNOSIS — K831 Obstruction of bile duct: Secondary | ICD-10-CM

## 2018-12-27 DIAGNOSIS — O26619 Liver and biliary tract disorders in pregnancy, unspecified trimester: Secondary | ICD-10-CM | POA: Insufficient documentation

## 2018-12-27 LAB — BILE ACIDS, TOTAL: Bile Acids Total: 9.9 umol/L (ref 0.0–10.0)

## 2018-12-27 MED ORDER — URSODIOL 500 MG PO TABS: 500.0000 mg | ORAL_TABLET | Freq: Two times a day (BID) | ORAL | 3 refills | Status: DC

## 2018-12-27 NOTE — Telephone Encounter (Signed)
Patient made aware of diagnosis of cholestasis. Patient made aware that medication sent to her pharmacy.   And made aware of the plan from Dr. Elly Modena that she is to be delivered at 37 weeks. Kathrene Alu RN

## 2018-12-27 NOTE — Telephone Encounter (Signed)
-----   Message from Mora Bellman, MD sent at 12/27/2018  9:48 AM EDT ----- Please inform patient of abnormal blood work and diagnosis of cholestasis of pregnancy which is responsible for her itching. A prescription was sent to her pharmacy. Plan is still for delivery on 01/09/19 at 37 weeks

## 2019-01-01 ENCOUNTER — Other Ambulatory Visit (HOSPITAL_COMMUNITY)
Admission: RE | Admit: 2019-01-01 | Discharge: 2019-01-01 | Disposition: A | Discharge status: Home / Self Care | Payer: Medicaid Other | Source: Ambulatory Visit | Attending: Family Medicine | Admitting: Family Medicine

## 2019-01-01 ENCOUNTER — Telehealth: Payer: Self-pay

## 2019-01-01 ENCOUNTER — Ambulatory Visit (INDEPENDENT_AMBULATORY_CARE_PROVIDER_SITE_OTHER): Payer: Medicaid Other | Admitting: Family Medicine

## 2019-01-01 ENCOUNTER — Encounter: Payer: Self-pay | Admitting: Family Medicine

## 2019-01-01 ENCOUNTER — Other Ambulatory Visit: Payer: Self-pay

## 2019-01-01 VITALS — BP 119/77 | HR 92 | Wt 209.0 lb

## 2019-01-01 DIAGNOSIS — Z3A35 35 weeks gestation of pregnancy: Secondary | ICD-10-CM

## 2019-01-01 DIAGNOSIS — O0993 Supervision of high risk pregnancy, unspecified, third trimester: Secondary | ICD-10-CM

## 2019-01-01 DIAGNOSIS — O4403 Placenta previa specified as without hemorrhage, third trimester: Secondary | ICD-10-CM

## 2019-01-01 DIAGNOSIS — Z3009 Encounter for other general counseling and advice on contraception: Secondary | ICD-10-CM

## 2019-01-01 DIAGNOSIS — O0990 Supervision of high risk pregnancy, unspecified, unspecified trimester: Secondary | ICD-10-CM

## 2019-01-01 DIAGNOSIS — Z98891 History of uterine scar from previous surgery: Secondary | ICD-10-CM

## 2019-01-01 DIAGNOSIS — O4402 Placenta previa specified as without hemorrhage, second trimester: Secondary | ICD-10-CM

## 2019-01-01 NOTE — Telephone Encounter (Signed)
Pt seen in office this morning c/o hemorrhoid.  Reports itching, denies pain. Pt to try Tucks pad or Prep H ointment or cream apply as directed. If sx's does not subside or worsens, contact the office.  Pt agrees and has no further questions.

## 2019-01-01 NOTE — Progress Notes (Signed)
ROB   CC: None    

## 2019-01-01 NOTE — Progress Notes (Signed)
   PRENATAL VISIT NOTE  Subjective:  [name] is a 33 y.o. [phone] at [redacted]w[redacted]d being seen today for ongoing prenatal care.  She is currently monitored for the following issues for this high-risk pregnancy and has PAP SMEAR, ABNORMAL; S/P cesarean section; History of postpartum depression, currently pregnant; Supervision of high risk pregnancy, antepartum; Placenta previa antepartum in second trimester; Marijuana use; Unwanted fertility; and Cholestasis of pregnancy, antepartum on their problem list.  Patient reports her abdomen tightening about once daily. Denies other complaints.  Contractions: Irritability. Vag. Bleeding: None.  Movement: Present. Denies leaking of fluid.   The following portions of the patient's history were reviewed and updated as appropriate: allergies, current medications, past family history, past medical history, past social history, past surgical history and problem list.   Objective:   Vitals:   01/01/19 0919  BP: 119/77  Pulse: 92  Weight: 209 lb (94.8 kg)    Fetal Status: Fetal Heart Rate (bpm): 138 Fundal Height: 37 cm Movement: Present     General:  Alert, oriented and cooperative. Patient is in no acute distress.  Skin: Skin is warm and dry. No rash noted.   Cardiovascular: Normal heart rate noted  Respiratory: Normal respiratory effort, no problems with respiration noted  Abdomen: Soft, gravid, appropriate for gestational age.  Pain/Pressure: Present     Pelvic: Cervical exam deferred        Extremities: Normal range of motion.  Edema: Trace  Mental Status: Normal mood and affect. Normal behavior. Normal judgment and thought content.   Assessment and Plan:  Pregnancy: G4P1021 at [redacted]w[redacted]d 1. Supervision of high risk pregnancy, antepartum Cervicovaginal and GBS swabs today Girl, breast & bottle, BTL   2. S/P cesarean section 3. Placenta previa antepartum in second trimester Repeat with BTL scheduled 10/28 (37 weeks) Last [place] 10/6: Normal  growth. Posterior placenta previa with no evidence of accreta   4. Unwanted fertility BTL paperwork signed 8/25  Preterm labor symptoms and general obstetric precautions including but not limited to vaginal bleeding, contractions, leaking of fluid and fetal movement were reviewed in detail with the patient. Please refer to After Visit Summary for other counseling recommendations.   Return None, repeat section scheduled next week.  Future Appointments  Date Time Provider [address]  01/07/2019  8:50 AM [facility] 1 [facility] None    Chauncey Mann, MD

## 2019-01-03 LAB — STREP GP B NAA: Strep Gp B NAA: NEGATIVE

## 2019-01-07 ENCOUNTER — Other Ambulatory Visit: Payer: Self-pay

## 2019-01-07 ENCOUNTER — Other Ambulatory Visit (HOSPITAL_COMMUNITY)
Admission: RE | Admit: 2019-01-07 | Discharge: 2019-01-07 | Disposition: A | Discharge status: Home / Self Care | Payer: Medicaid Other | Source: Ambulatory Visit | Attending: Obstetrics and Gynecology | Admitting: Obstetrics and Gynecology

## 2019-01-07 DIAGNOSIS — O26613 Liver and biliary tract disorders in pregnancy, third trimester: Secondary | ICD-10-CM | POA: Insufficient documentation

## 2019-01-07 DIAGNOSIS — Z01812 Encounter for preprocedural laboratory examination: Secondary | ICD-10-CM | POA: Diagnosis present

## 2019-01-07 DIAGNOSIS — Z20828 Contact with and (suspected) exposure to other viral communicable diseases: Secondary | ICD-10-CM | POA: Insufficient documentation

## 2019-01-07 DIAGNOSIS — K831 Obstruction of bile duct: Secondary | ICD-10-CM | POA: Insufficient documentation

## 2019-01-07 LAB — CBC
HCT: 29.5 % — ABNORMAL LOW (ref 36.0–46.0)
Hemoglobin: 9.4 g/dL — ABNORMAL LOW (ref 12.0–15.0)
MCH: 23.3 pg — ABNORMAL LOW (ref 26.0–34.0)
MCHC: 31.9 g/dL (ref 30.0–36.0)
MCV: 73.2 fL — ABNORMAL LOW (ref 80.0–100.0)
Platelets: 344 10*3/uL (ref 150–400)
RBC: 4.03 MIL/uL (ref 3.87–5.11)
RDW: 15.3 % (ref 11.5–15.5)
WBC: 10.4 10*3/uL (ref 4.0–10.5)
nRBC: 0 % (ref 0.0–0.2)

## 2019-01-07 LAB — SARS CORONAVIRUS 2 (TAT 6-24 HRS): SARS Coronavirus 2: NEGATIVE

## 2019-01-07 LAB — RAPID HIV SCREEN (HIV 1/2 AB+AG)
HIV 1/2 Antibodies: NONREACTIVE
HIV-1 P24 Antigen - HIV24: NONREACTIVE

## 2019-01-07 LAB — RPR: RPR Ser Ql: NONREACTIVE

## 2019-01-07 NOTE — MAU Note (Signed)
Covid swab collected.Pt tolerated well.PT asymptomatic. Lab in with pt to draw CBC/RPR/ and Type and screen

## 2019-01-08 NOTE — Anesthesia Preprocedure Evaluation (Addendum)
Anesthesia Evaluation  Patient identified by MRN, date of birth, ID band Patient awake    Reviewed: Allergy & Precautions, H&P , NPO status , Patient's Chart, lab work & pertinent test results  Airway Mallampati: II  TM Distance: >3 FB Neck ROM: full    Dental no notable dental hx. (+) Teeth Intact   Pulmonary neg pulmonary ROS, former smoker,    Pulmonary exam normal breath sounds clear to auscultation       Cardiovascular negative cardio ROS   Rhythm:regular Rate:Normal     Neuro/Psych PSYCHIATRIC DISORDERS Depression negative neurological ROS     GI/Hepatic negative GI ROS, (+)     substance abuse  marijuana use,   Endo/Other  negative endocrine ROS  Renal/GU negative Renal ROS  Female GU complaint Menorrhagia, hx ectopic    Musculoskeletal negative musculoskeletal ROS (+)   Abdominal Normal abdominal exam  (+) + obese,   Peds  Hematology negative hematology ROS (+) anemia ,   Anesthesia Other Findings   Reproductive/Obstetrics (+) Pregnancy Previous C/Section Placenta previa- complete Desires sterilization                            Anesthesia Physical  Anesthesia Plan  ASA: II  Anesthesia Plan: Spinal   Post-op Pain Management:    Induction:   PONV Risk Score and Plan: 2 and Ondansetron, Dexamethasone and Treatment may vary due to age or medical condition  Airway Management Planned: Natural Airway and Nasal Cannula  Additional Equipment: None  Intra-op Plan:   Post-operative Plan:   Informed Consent: I have reviewed the patients History and Physical, chart, labs and discussed the procedure including the risks, benefits and alternatives for the proposed anesthesia with the patient or authorized representative who has indicated his/her understanding and acceptance.     Dental advisory given  Plan Discussed with: CRNA and Surgeon  Anesthesia Plan Comments:         Anesthesia Quick Evaluation

## 2019-01-09 ENCOUNTER — Inpatient Hospital Stay (HOSPITAL_COMMUNITY): Payer: Medicaid Other | Admitting: Anesthesiology

## 2019-01-09 ENCOUNTER — Other Ambulatory Visit: Payer: Self-pay

## 2019-01-09 ENCOUNTER — Inpatient Hospital Stay (HOSPITAL_COMMUNITY)
Admission: RE | Admit: 2019-01-09 | Discharge: 2019-01-11 | DRG: 783 | Disposition: A | Discharge status: Home / Self Care | Payer: Medicaid Other | Attending: Family Medicine | Admitting: Family Medicine

## 2019-01-09 ENCOUNTER — Encounter (HOSPITAL_COMMUNITY)
Admission: RE | Disposition: A | Discharge status: Home / Self Care | Payer: Self-pay | Source: Home / Self Care | Attending: Family Medicine

## 2019-01-09 ENCOUNTER — Encounter (HOSPITAL_COMMUNITY): Payer: Self-pay | Admitting: *Deleted

## 2019-01-09 DIAGNOSIS — O9962 Diseases of the digestive system complicating childbirth: Secondary | ICD-10-CM | POA: Diagnosis not present

## 2019-01-09 DIAGNOSIS — Z302 Encounter for sterilization: Secondary | ICD-10-CM

## 2019-01-09 DIAGNOSIS — O0990 Supervision of high risk pregnancy, unspecified, unspecified trimester: Secondary | ICD-10-CM

## 2019-01-09 DIAGNOSIS — K831 Obstruction of bile duct: Secondary | ICD-10-CM | POA: Diagnosis present

## 2019-01-09 DIAGNOSIS — O26619 Liver and biliary tract disorders in pregnancy, unspecified trimester: Secondary | ICD-10-CM | POA: Diagnosis present

## 2019-01-09 DIAGNOSIS — O34211 Maternal care for low transverse scar from previous cesarean delivery: Principal | ICD-10-CM | POA: Diagnosis present

## 2019-01-09 DIAGNOSIS — F1290 Cannabis use, unspecified, uncomplicated: Secondary | ICD-10-CM | POA: Diagnosis present

## 2019-01-09 DIAGNOSIS — Z87891 Personal history of nicotine dependence: Secondary | ICD-10-CM | POA: Diagnosis not present

## 2019-01-09 DIAGNOSIS — Z3009 Encounter for other general counseling and advice on contraception: Secondary | ICD-10-CM | POA: Diagnosis present

## 2019-01-09 DIAGNOSIS — O2662 Liver and biliary tract disorders in childbirth: Secondary | ICD-10-CM | POA: Diagnosis present

## 2019-01-09 DIAGNOSIS — O99324 Drug use complicating childbirth: Secondary | ICD-10-CM | POA: Diagnosis present

## 2019-01-09 DIAGNOSIS — Z8659 Personal history of other mental and behavioral disorders: Secondary | ICD-10-CM

## 2019-01-09 DIAGNOSIS — O4402 Placenta previa specified as without hemorrhage, second trimester: Secondary | ICD-10-CM | POA: Diagnosis present

## 2019-01-09 DIAGNOSIS — Z98891 History of uterine scar from previous surgery: Principal | ICD-10-CM

## 2019-01-09 DIAGNOSIS — O99891 Other specified diseases and conditions complicating pregnancy: Secondary | ICD-10-CM

## 2019-01-09 DIAGNOSIS — O4403 Placenta previa specified as without hemorrhage, third trimester: Secondary | ICD-10-CM | POA: Diagnosis present

## 2019-01-09 DIAGNOSIS — Z3A37 37 weeks gestation of pregnancy: Secondary | ICD-10-CM

## 2019-01-09 LAB — CERVICOVAGINAL ANCILLARY ONLY
Bacterial Vaginitis (gardnerella): NEGATIVE
Candida Glabrata: NEGATIVE
Candida Vaginitis: NEGATIVE
Chlamydia: NEGATIVE
Comment: NEGATIVE
Comment: NEGATIVE
Comment: NEGATIVE
Comment: NEGATIVE
Comment: NEGATIVE
Comment: NORMAL
Neisseria Gonorrhea: NEGATIVE
Trichomonas: POSITIVE — AB

## 2019-01-09 LAB — PREPARE RBC (CROSSMATCH)

## 2019-01-09 SURGERY — Surgical Case
Anesthesia: Spinal | Laterality: Bilateral

## 2019-01-09 MED ORDER — FLEET ENEMA 7-19 GM/118ML RE ENEM: 1.0000 | ENEMA | Freq: Every day | RECTAL | Status: DC | PRN

## 2019-01-09 MED ORDER — FENTANYL CITRATE (PF) 100 MCG/2ML IJ SOLN
INTRAMUSCULAR | Status: DC | PRN
  Administered 2019-01-09: 15 ug via INTRATHECAL

## 2019-01-09 MED ORDER — DIPHENHYDRAMINE HCL 25 MG PO CAPS: 25.0000 mg | ORAL_CAPSULE | Freq: Four times a day (QID) | ORAL | Status: DC | PRN

## 2019-01-09 MED ORDER — SIMETHICONE 80 MG PO CHEW
80.0000 mg | CHEWABLE_TABLET | Freq: Three times a day (TID) | ORAL | Status: DC
  Administered 2019-01-09 – 2019-01-11 (×5): 80 mg via ORAL
  Filled 2019-01-09 (×5): qty 1

## 2019-01-09 MED ORDER — MENTHOL 3 MG MT LOZG: 1.0000 | LOZENGE | OROMUCOSAL | Status: DC | PRN

## 2019-01-09 MED ORDER — METOCLOPRAMIDE HCL 5 MG/ML IJ SOLN
INTRAMUSCULAR | Status: DC | PRN
  Administered 2019-01-09: 10 mg via INTRAVENOUS

## 2019-01-09 MED ORDER — SOD CITRATE-CITRIC ACID 500-334 MG/5ML PO SOLN
30.0000 mL | Freq: Once | ORAL | Status: AC
  Administered 2019-01-09: 30 mL via ORAL

## 2019-01-09 MED ORDER — METOCLOPRAMIDE HCL 5 MG/ML IJ SOLN
INTRAMUSCULAR | Status: AC
  Filled 2019-01-09: qty 2

## 2019-01-09 MED ORDER — SIMETHICONE 80 MG PO CHEW: 80.0000 mg | CHEWABLE_TABLET | ORAL | Status: DC | PRN

## 2019-01-09 MED ORDER — LACTATED RINGERS IV SOLN
INTRAVENOUS | Status: DC
  Administered 2019-01-09: 22:00:00 via INTRAVENOUS

## 2019-01-09 MED ORDER — OXYCODONE HCL 5 MG PO TABS
5.0000 mg | ORAL_TABLET | ORAL | Status: DC | PRN
  Administered 2019-01-10 – 2019-01-11 (×4): 5 mg via ORAL
  Filled 2019-01-09 (×4): qty 1

## 2019-01-09 MED ORDER — MORPHINE SULFATE (PF) 0.5 MG/ML IJ SOLN
INTRAMUSCULAR | Status: DC | PRN
  Administered 2019-01-09: 150 ug via INTRATHECAL

## 2019-01-09 MED ORDER — KETOROLAC TROMETHAMINE 30 MG/ML IJ SOLN
INTRAMUSCULAR | Status: AC
  Filled 2019-01-09: qty 1

## 2019-01-09 MED ORDER — KETOROLAC TROMETHAMINE 30 MG/ML IJ SOLN
30.0000 mg | Freq: Four times a day (QID) | INTRAMUSCULAR | Status: AC | PRN
  Administered 2019-01-09 (×2): 30 mg via INTRAVENOUS
  Filled 2019-01-09 (×2): qty 1

## 2019-01-09 MED ORDER — DIPHENHYDRAMINE HCL 50 MG/ML IJ SOLN
12.5000 mg | INTRAMUSCULAR | Status: DC | PRN
  Administered 2019-01-09 (×3): 12.5 mg via INTRAVENOUS
  Filled 2019-01-09 (×2): qty 1

## 2019-01-09 MED ORDER — NALBUPHINE HCL 10 MG/ML IJ SOLN
5.0000 mg | Freq: Once | INTRAMUSCULAR | Status: AC | PRN
  Filled 2019-01-09: qty 0.5

## 2019-01-09 MED ORDER — ONDANSETRON HCL 4 MG/2ML IJ SOLN: 4.0000 mg | Freq: Three times a day (TID) | INTRAMUSCULAR | Status: DC | PRN

## 2019-01-09 MED ORDER — LACTATED RINGERS IV SOLN
INTRAVENOUS | Status: DC
  Administered 2019-01-09 (×3): via INTRAVENOUS

## 2019-01-09 MED ORDER — SOD CITRATE-CITRIC ACID 500-334 MG/5ML PO SOLN: 30.0000 mL | ORAL | Status: DC

## 2019-01-09 MED ORDER — DEXTROSE 5 % IV SOLN: 3.0000 g | INTRAVENOUS | Status: DC

## 2019-01-09 MED ORDER — MEPERIDINE HCL 25 MG/ML IJ SOLN: 6.2500 mg | INTRAMUSCULAR | Status: DC | PRN

## 2019-01-09 MED ORDER — PHENYLEPHRINE 40 MCG/ML (10ML) SYRINGE FOR IV PUSH (FOR BLOOD PRESSURE SUPPORT)
PREFILLED_SYRINGE | INTRAVENOUS | Status: AC
  Filled 2019-01-09: qty 10

## 2019-01-09 MED ORDER — OXYCODONE HCL 5 MG PO TABS: 5.0000 mg | ORAL_TABLET | Freq: Once | ORAL | Status: DC | PRN

## 2019-01-09 MED ORDER — PHENYLEPHRINE HCL-NACL 20-0.9 MG/250ML-% IV SOLN
INTRAVENOUS | Status: AC
  Filled 2019-01-09: qty 250

## 2019-01-09 MED ORDER — SENNOSIDES-DOCUSATE SODIUM 8.6-50 MG PO TABS
2.0000 | ORAL_TABLET | ORAL | Status: DC
  Administered 2019-01-09 – 2019-01-10 (×2): 2 via ORAL
  Filled 2019-01-09 (×2): qty 2

## 2019-01-09 MED ORDER — ONDANSETRON HCL 4 MG/2ML IJ SOLN
INTRAMUSCULAR | Status: DC | PRN
  Administered 2019-01-09: 4 mg via INTRAVENOUS

## 2019-01-09 MED ORDER — WITCH HAZEL-GLYCERIN EX PADS: 1.0000 "application " | MEDICATED_PAD | CUTANEOUS | Status: DC | PRN

## 2019-01-09 MED ORDER — SCOPOLAMINE 1 MG/3DAYS TD PT72: 1.0000 | MEDICATED_PATCH | Freq: Once | TRANSDERMAL | Status: DC

## 2019-01-09 MED ORDER — SODIUM CHLORIDE 0.9% FLUSH: 3.0000 mL | INTRAVENOUS | Status: DC | PRN

## 2019-01-09 MED ORDER — HYDROMORPHONE HCL 1 MG/ML IJ SOLN
INTRAMUSCULAR | Status: AC
  Filled 2019-01-09: qty 1

## 2019-01-09 MED ORDER — DIPHENHYDRAMINE HCL 50 MG/ML IJ SOLN
INTRAMUSCULAR | Status: AC
  Filled 2019-01-09: qty 1

## 2019-01-09 MED ORDER — ZOLPIDEM TARTRATE 5 MG PO TABS: 5.0000 mg | ORAL_TABLET | Freq: Every evening | ORAL | Status: DC | PRN

## 2019-01-09 MED ORDER — KETOROLAC TROMETHAMINE 30 MG/ML IJ SOLN: 30.0000 mg | Freq: Four times a day (QID) | INTRAMUSCULAR | Status: AC | PRN

## 2019-01-09 MED ORDER — FENTANYL CITRATE (PF) 100 MCG/2ML IJ SOLN
INTRAMUSCULAR | Status: AC
  Filled 2019-01-09: qty 2

## 2019-01-09 MED ORDER — SCOPOLAMINE 1 MG/3DAYS TD PT72
1.0000 | MEDICATED_PATCH | Freq: Once | TRANSDERMAL | Status: DC
  Administered 2019-01-09: 1.5 mg via TRANSDERMAL

## 2019-01-09 MED ORDER — PHENYLEPHRINE HCL-NACL 20-0.9 MG/250ML-% IV SOLN
INTRAVENOUS | Status: DC | PRN
  Administered 2019-01-09: 60 ug/min via INTRAVENOUS

## 2019-01-09 MED ORDER — OXYTOCIN 40 UNITS IN NORMAL SALINE INFUSION - SIMPLE MED: 2.5000 [IU]/h | INTRAVENOUS | Status: AC

## 2019-01-09 MED ORDER — NALOXONE HCL 4 MG/10ML IJ SOLN
1.0000 ug/kg/h | INTRAVENOUS | Status: DC | PRN
  Filled 2019-01-09: qty 5

## 2019-01-09 MED ORDER — SIMETHICONE 80 MG PO CHEW
80.0000 mg | CHEWABLE_TABLET | ORAL | Status: DC
  Administered 2019-01-09 – 2019-01-10 (×2): 80 mg via ORAL
  Filled 2019-01-09 (×2): qty 1

## 2019-01-09 MED ORDER — PRENATAL MULTIVITAMIN CH
1.0000 | ORAL_TABLET | Freq: Every day | ORAL | Status: DC
  Administered 2019-01-10: 1 via ORAL
  Filled 2019-01-09: qty 1

## 2019-01-09 MED ORDER — DEXAMETHASONE SODIUM PHOSPHATE 4 MG/ML IJ SOLN
INTRAMUSCULAR | Status: AC
  Filled 2019-01-09: qty 7

## 2019-01-09 MED ORDER — DIPHENHYDRAMINE HCL 25 MG PO CAPS: 25.0000 mg | ORAL_CAPSULE | ORAL | Status: DC | PRN

## 2019-01-09 MED ORDER — PROMETHAZINE HCL 25 MG/ML IJ SOLN: 6.2500 mg | INTRAMUSCULAR | Status: DC | PRN

## 2019-01-09 MED ORDER — KETOROLAC TROMETHAMINE 30 MG/ML IJ SOLN
30.0000 mg | Freq: Once | INTRAMUSCULAR | Status: AC | PRN
  Administered 2019-01-09: 30 mg via INTRAVENOUS

## 2019-01-09 MED ORDER — SCOPOLAMINE 1 MG/3DAYS TD PT72
MEDICATED_PATCH | TRANSDERMAL | Status: AC
  Filled 2019-01-09: qty 1

## 2019-01-09 MED ORDER — NALOXONE HCL 0.4 MG/ML IJ SOLN: 0.4000 mg | INTRAMUSCULAR | Status: DC | PRN

## 2019-01-09 MED ORDER — CEFAZOLIN SODIUM-DEXTROSE 1-4 GM/50ML-% IV SOLN
INTRAVENOUS | Status: DC | PRN
  Administered 2019-01-09: 2 g via INTRAVENOUS

## 2019-01-09 MED ORDER — DEXTROSE 5 % IV SOLN
INTRAVENOUS | Status: AC
  Filled 2019-01-09: qty 3000

## 2019-01-09 MED ORDER — IBUPROFEN 800 MG PO TABS
800.0000 mg | ORAL_TABLET | Freq: Three times a day (TID) | ORAL | Status: DC
  Administered 2019-01-10 – 2019-01-11 (×4): 800 mg via ORAL
  Filled 2019-01-09 (×4): qty 1

## 2019-01-09 MED ORDER — SOD CITRATE-CITRIC ACID 500-334 MG/5ML PO SOLN
ORAL | Status: AC
  Filled 2019-01-09: qty 30

## 2019-01-09 MED ORDER — COCONUT OIL OIL: 1.0000 "application " | TOPICAL_OIL | Status: DC | PRN

## 2019-01-09 MED ORDER — BUPIVACAINE IN DEXTROSE 0.75-8.25 % IT SOLN
INTRATHECAL | Status: DC | PRN
  Administered 2019-01-09: 1.6 mL via INTRATHECAL

## 2019-01-09 MED ORDER — OXYCODONE HCL 5 MG/5ML PO SOLN: 5.0000 mg | Freq: Once | ORAL | Status: DC | PRN

## 2019-01-09 MED ORDER — CEFAZOLIN SODIUM-DEXTROSE 2-4 GM/100ML-% IV SOLN
INTRAVENOUS | Status: AC
  Filled 2019-01-09: qty 100

## 2019-01-09 MED ORDER — NALBUPHINE HCL 10 MG/ML IJ SOLN
5.0000 mg | Freq: Once | INTRAMUSCULAR | Status: AC | PRN
  Administered 2019-01-10: 5 mg via INTRAVENOUS
  Filled 2019-01-09 (×3): qty 0.5

## 2019-01-09 MED ORDER — SODIUM CHLORIDE 0.9% IV SOLUTION
Freq: Once | INTRAVENOUS | Status: AC
  Administered 2019-01-09: 11:00:00 400 mL via INTRAVENOUS
  Administered 2019-01-09: 10:00:00 via INTRAVENOUS

## 2019-01-09 MED ORDER — MORPHINE SULFATE (PF) 0.5 MG/ML IJ SOLN
INTRAMUSCULAR | Status: AC
  Filled 2019-01-09: qty 10

## 2019-01-09 MED ORDER — HYDROMORPHONE HCL 1 MG/ML IJ SOLN
0.2500 mg | INTRAMUSCULAR | Status: DC | PRN
  Administered 2019-01-09 (×2): 0.25 mg via INTRAVENOUS
  Administered 2019-01-09: 0.5 mg via INTRAVENOUS

## 2019-01-09 MED ORDER — NALBUPHINE HCL 10 MG/ML IJ SOLN
5.0000 mg | INTRAMUSCULAR | Status: DC | PRN
  Administered 2019-01-09: 5 mg via INTRAVENOUS
  Filled 2019-01-09 (×5): qty 0.5

## 2019-01-09 MED ORDER — ACETAMINOPHEN 500 MG PO TABS
1000.0000 mg | ORAL_TABLET | Freq: Four times a day (QID) | ORAL | Status: AC
  Administered 2019-01-09 – 2019-01-10 (×3): 1000 mg via ORAL
  Filled 2019-01-09 (×3): qty 2

## 2019-01-09 MED ORDER — SODIUM CHLORIDE 0.9 % IR SOLN
Status: DC | PRN
  Administered 2019-01-09: 1

## 2019-01-09 MED ORDER — ONDANSETRON HCL 4 MG/2ML IJ SOLN
INTRAMUSCULAR | Status: AC
  Filled 2019-01-09: qty 2

## 2019-01-09 MED ORDER — BISACODYL 10 MG RE SUPP: 10.0000 mg | Freq: Every day | RECTAL | Status: DC | PRN

## 2019-01-09 MED ORDER — OXYTOCIN 10 UNIT/ML IJ SOLN
INTRAMUSCULAR | Status: DC | PRN
  Administered 2019-01-09: 40 [IU]

## 2019-01-09 MED ORDER — OXYTOCIN 40 UNITS IN NORMAL SALINE INFUSION - SIMPLE MED
INTRAVENOUS | Status: AC
  Filled 2019-01-09: qty 1000

## 2019-01-09 MED ORDER — METRONIDAZOLE 500 MG PO TABS
500.0000 mg | ORAL_TABLET | Freq: Two times a day (BID) | ORAL | Status: DC
  Administered 2019-01-09 – 2019-01-11 (×4): 500 mg via ORAL
  Filled 2019-01-09 (×5): qty 1

## 2019-01-09 MED ORDER — DEXAMETHASONE SODIUM PHOSPHATE 4 MG/ML IJ SOLN
INTRAMUSCULAR | Status: DC | PRN
  Administered 2019-01-09: 4 mg via INTRAVENOUS

## 2019-01-09 MED ORDER — DIBUCAINE (PERIANAL) 1 % EX OINT
1.0000 "application " | TOPICAL_OINTMENT | CUTANEOUS | Status: DC | PRN
  Filled 2019-01-09: qty 28

## 2019-01-09 MED ORDER — NALBUPHINE HCL 10 MG/ML IJ SOLN
5.0000 mg | INTRAMUSCULAR | Status: DC | PRN
  Administered 2019-01-10: 5 mg via SUBCUTANEOUS
  Filled 2019-01-09 (×3): qty 0.5

## 2019-01-09 SURGICAL SUPPLY — 35 items
APL SKNCLS STERI-STRIP NONHPOA (GAUZE/BANDAGES/DRESSINGS) ×1
BENZOIN TINCTURE PRP APPL 2/3 (GAUZE/BANDAGES/DRESSINGS) ×3 IMPLANT
CANISTER SUCT 3000ML PPV (MISCELLANEOUS) ×3 IMPLANT
CHLORAPREP W/TINT 26ML (MISCELLANEOUS) ×3 IMPLANT
CLAMP CORD UMBIL (MISCELLANEOUS) ×3 IMPLANT
CLIP FILSHIE TUBAL LIGA STRL (Clip) ×3 IMPLANT
CLOSURE WOUND 1/2 X4 (GAUZE/BANDAGES/DRESSINGS) ×1
CLOTH BEACON ORANGE TIMEOUT ST (SAFETY) ×3 IMPLANT
DRAPE C SECTION CLR SCREEN (DRAPES) ×2 IMPLANT
DRAPE SHEET LG 3/4 BI-LAMINATE (DRAPES) ×3 IMPLANT
DRSG OPSITE POSTOP 4X10 (GAUZE/BANDAGES/DRESSINGS) ×3 IMPLANT
ELECT REM PT RETURN 9FT ADLT (ELECTROSURGICAL) ×3
ELECTRODE REM PT RTRN 9FT ADLT (ELECTROSURGICAL) ×1 IMPLANT
GAUZE SPONGE 4X4 12PLY STRL LF (GAUZE/BANDAGES/DRESSINGS) ×2 IMPLANT
GLOVE BIOGEL PI IND STRL 7.0 (GLOVE) ×3 IMPLANT
GLOVE BIOGEL PI INDICATOR 7.0 (GLOVE) ×6
GLOVE ECLIPSE 6.5 STRL STRAW (GLOVE) ×3 IMPLANT
GOWN STRL REUS W/ TWL LRG LVL3 (GOWN DISPOSABLE) ×2 IMPLANT
GOWN STRL REUS W/TWL LRG LVL3 (GOWN DISPOSABLE) ×6
NS IRRIG 1000ML POUR BTL (IV SOLUTION) ×3 IMPLANT
PAD ABD 7.5X8 STRL (GAUZE/BANDAGES/DRESSINGS) ×2 IMPLANT
PAD OB MATERNITY 4.3X12.25 (PERSONAL CARE ITEMS) ×3 IMPLANT
PAD PREP 24X48 CUFFED NSTRL (MISCELLANEOUS) ×3 IMPLANT
RETRACTOR WND ALEXIS 25 LRG (MISCELLANEOUS) IMPLANT
RTRCTR WOUND ALEXIS 25CM LRG (MISCELLANEOUS) ×3
SPONGE LAP 18X18 RF (DISPOSABLE) ×2 IMPLANT
STRIP CLOSURE SKIN 1/2X4 (GAUZE/BANDAGES/DRESSINGS) ×2 IMPLANT
SUT PLAIN 2 0 XLH (SUTURE) ×3 IMPLANT
SUT VIC AB 0 CT1 36 (SUTURE) ×6 IMPLANT
SUT VIC AB 2-0 CT1 27 (SUTURE) ×3
SUT VIC AB 2-0 CT1 TAPERPNT 27 (SUTURE) ×1 IMPLANT
SUT VIC AB 4-0 KS 27 (SUTURE) ×3 IMPLANT
TOWEL OR 17X24 6PK STRL BLUE (TOWEL DISPOSABLE) ×9 IMPLANT
TRAY FOLEY CATH SILVER 16FR (SET/KITS/TRAYS/PACK) ×3 IMPLANT
WATER STERILE IRR 1000ML POUR (IV SOLUTION) ×3 IMPLANT

## 2019-01-09 NOTE — Anesthesia Postprocedure Evaluation (Signed)
Anesthesia Post Note  Patient: [name]  Procedure(s) Performed: CESAREAN SECTION WITH BILATERAL TUBAL LIGATION (Bilateral )     Patient location during evaluation: PACU Anesthesia Type: Spinal Level of consciousness: oriented and awake and alert Pain management: pain level controlled Vital Signs Assessment: post-procedure vital signs reviewed and stable Respiratory status: spontaneous breathing, respiratory function stable and nonlabored ventilation Cardiovascular status: blood pressure returned to baseline and stable Postop Assessment: no headache, no backache, no apparent nausea or vomiting, patient able to bend at knees and spinal receding Anesthetic complications: no    Last Vitals:  Vitals:   01/09/19 1200 01/09/19 1206  BP: 129/90 136/90  Pulse: 63 (!) 50  Resp: 11 18  Temp: 36.6 C 36.6 C  SpO2: 100% 100%    Last Pain:  Vitals:   01/09/19 1206  TempSrc: Oral  PainSc:    Pain Goal:    LLE Motor Response: Purposeful movement (01/09/19 1200) LLE Sensation: Tingling (01/09/19 1200) RLE Motor Response: Purposeful movement (01/09/19 1200) RLE Sensation: Tingling (01/09/19 1200)     Epidural/Spinal Function Cutaneous sensation: Tingles (01/09/19 1200), Patient able to flex knees: Yes (01/09/19 1200), Patient able to lift hips off bed: No (01/09/19 1200), Back pain beyond tenderness at insertion site: No (01/09/19 1200), Progressively worsening motor and/or sensory loss: No (01/09/19 1200), Bowel and/or bladder incontinence post epidural: No (01/09/19 1200)  [name] A.

## 2019-01-09 NOTE — Op Note (Addendum)
[name] PROCEDURE DATE: 01/09/2019  PREOPERATIVE DIAGNOSES: Intrauterine pregnancy at [redacted]w[redacted]d weeks gestation; placenta previa and previous uterine incision low transverse, cholestasis of pregnancy; undesired fertility  POSTOPERATIVE DIAGNOSES: The same  PROCEDURE: Repeat Low Transverse Cesarean Section, Bilateral Tubal Sterilization using Filshie clips  SURGEON:  Dr. Lauretta Chester  ASSISTANT:  Dr. Augustin Coupe  ANESTHESIOLOGIST: Dr. Royce Macadamia  INDICATIONS: [name] is a 33 y.o. [id number] at [redacted]w[redacted]d here for cesarean section and bilateral tubal sterilization secondary to the indications listed under preoperative diagnoses; please see preoperative note for further details.  The risks of surgery were discussed with the patient including but were not limited to: bleeding which may require transfusion or reoperation; infection which may require antibiotics; injury to bowel, bladder, ureters or other surrounding organs; injury to the fetus; need for additional procedures including hysterectomy in the event of a life-threatening hemorrhage; placental abnormalities wth subsequent pregnancies, incisional problems, thromboembolic phenomenon and other postoperative/anesthesia complications.  Patient also desires permanent sterilization.  Other reversible forms of contraception were discussed with patient; she declines all other modalities. Risks of procedure discussed with patient including but not limited to: risk of regret, permanence of method, bleeding, infection, injury to surrounding organs and need for additional procedures.  Failure risk of 1-2% with increased risk of ectopic gestation if pregnancy occurs was also discussed with patient.  The patient concurred with the proposed plan, giving informed written consent for the procedures.    FINDINGS:  Viable female infant in cephalic presentation.  Apgars 8 and 9. Weight 2790 grams. Clear amniotic fluid.  Intact placenta,  three vessel cord.  Normal uterus, fallopian tubes and ovaries bilaterally. Fallopian tubes sterilized with Filshie clips bilaterally.  ANESTHESIA: Spinal INTRAVENOUS FLUIDS: 2700 ml ESTIMATED BLOOD LOSS: 884 ml URINE OUTPUT:  150 ml SPECIMENS: Placenta sent to L&D COMPLICATIONS: None immediate  PROCEDURE IN DETAIL:  The patient preoperatively received intravenous antibiotics and had sequential compression devices applied to her lower extremities.   She was then taken to the operating room where spinal anesthesia was administered and was found to be adequate. She was then placed in a dorsal supine position with a leftward tilt, and prepped and draped in a sterile manner.  A foley catheter was placed into her bladder and attached to constant gravity.  After an adequate timeout was performed, a Pfannenstiel skin incision was made with scalpel and carried through to the underlying layer of fascia. The fascia was incised in the midline, and this incision was extended bilaterally using the Mayo scissors.  Kocher clamps were applied to the superior aspect of the fascial incision and the underlying rectus muscles were dissected off bluntly. A similar process was carried out on the inferior aspect of the fascial incision. The rectus muscles were separated in the midline bluntly and the peritoneum was entered bluntly. Attention was turned to the lower uterine segment where a low transverse hysterotomy was made with a scalpel and extended bilaterally bluntly.  The infant was successfully delivered, the cord was clamped and cut and the infant was handed over to awaiting neonatology team. Uterine massage was then administered, and the placenta delivered intact with a three-vessel cord. The uterus was then cleared of clot and debris.  The hysterotomy was closed with 0 Vicryl in a running locked fashion, and an imbricating layer was also placed with 0 Vicryl. Attention was then turned to the fallopian tubes, and Filshie  clips were placed about 3 cm from the cornua, with care given to incorporate  the underlying mesosalpinx on both sides, allowing for bilateral tubal sterilization. The pelvis was cleared of all clot and debris. Hemostasis was confirmed on all surfaces.  The peritoneum was reapproximated using 0 Vicryl interrupted stitches. The fascia was then closed using 0 Vicryl in a running fashion.  The subcutaneous layer was irrigated, then reapproximated with 2-0 plain gut interrupted stitches. The skin was closed with a 4-0 Vicryl subcuticular stitch. The patient tolerated the procedure well. Sponge, lap, instrument and needle counts were correct x 2.  She was taken to the recovery room in stable condition.   [name], MD, MPH OB Fellow Faculty Practice- Center for [facility]

## 2019-01-09 NOTE — H&P (Signed)
Obstetric Preoperative History and Physical  [name] is a 33 y.o. [phone] with IUP at 13w0dpresenting for presenting for scheduled cesarean section.  No acute concerns.   Prenatal Course Source of Care: [facility] with onset of care at 13 weeks Pregnancy complications or risks: Patient Active Problem List   Diagnosis Date Noted  . S/P repeat low transverse C-section 01/09/2019  . Cholestasis of pregnancy, antepartum 12/27/2018  . Unwanted fertility 11/27/2018  . S/P cesarean section 09/28/2018  . History of postpartum depression, currently pregnant 09/28/2018  . Supervision of high risk pregnancy, antepartum 09/28/2018  . Placenta previa antepartum in second trimester 09/28/2018  . Marijuana use 09/28/2018  . PAP SMEAR, ABNORMAL 06/21/2004   She plans to bottle feed She desires bilateral tubal ligation for postpartum contraception.   Prenatal labs and studies: ABO, Rh: --/--/AB POS (10/26 08590 Antibody: NEG (10/26 0925) Rubella: Immune (05/01 0000) RPR: NON REACTIVE (10/26 0921)  HBsAg: Negative (05/01 0000)  HIV: NON REACTIVE (10/26 0921)  GBS:--/Negative (10/20 0932) 1 hr Glucola  128 Genetic screening normal Anatomy [place]ormal  Prenatal Transfer Tool  Maternal Diabetes: No Genetic Screening: Normal Maternal Ultrasounds/Referrals: Normal Fetal Ultrasounds or other Referrals:  None Maternal Substance Abuse:  No Significant Maternal Medications:  None Significant Maternal Lab Results: Group B Strep negative  Past Medical History:  Diagnosis Date  . Anemia   . Blood transfusion    can't remember dates but NOT with last pregnancy  . Depression    Postpartum  . Ectopic pregnancy   . Menorrhagia 2007   blood transfusion    Past Surgical History:  Procedure Laterality Date  . CESAREAN SECTION  03/28/2012   Procedure: CESAREAN SECTION;  Surgeon: BFrederico Hamman MD;  [facility];  Service: Obstetrics;  Laterality: N/A;  Primary Cesarean  Section Delivery Baby Boy @ 0543, Apgars 8/8  . INDUCED ABORTION    . WISDOM TOOTH EXTRACTION  2013    OB History  Gravida Para Term Preterm AB Living  _0 SAB TAB Ectopic Multiple Live Births    _1 # Outcome Date GA Lbr Len/2nd Weight Sex Delivery Anes PTL Lv  4 Current           3 Term 03/28/12 [id number] g M CS-LTranv EPI  LIV  2 TAB 2011 7w38w0d      1 Ectopic             Social History   Socioeconomic History  . Marital status: Single    Spouse name: Not on file  . Number of children: Not on file  . Years of education: Not on file  . Highest education level: Not on file  Occupational History  . Not on file  Social Needs  . Financial resource strain: Not on file  . Food insecurity    Worry: Not on file    Inability: Not on file  . Transportation needs    Medical: Not on file    Non-medical: Not on file  Tobacco Use  . Smoking status: Former [employer] scientist (life sciences) Smokeless tobacco: Never Used  Substance and Sexual Activity  . Alcohol use: No  . Drug use: Yes    Types: Marijuana    Comment: last used over a 1 month ago  . Sexual activity: Yes    Partners: Male    Birth control/protection: None    Comment:  pregnant  Lifestyle  . Physical activity    Days per week: Not on file    Minutes per session: Not on file  . Stress: Not on file  Relationships  . Social Herbalist on phone: Not on file    Gets together: Not on file    Attends religious service: Not on file    Active member of club or organization: Not on file    Attends meetings of clubs or organizations: Not on file    Relationship status: Not on file  Other Topics Concern  . Not on file  Social History Narrative  . Not on file    Family History  Problem Relation Age of Onset  . Arthritis Father   . Diabetes Father   . Hypertension Father   . Heart disease Father   . Arthritis Maternal Grandmother   . Depression Maternal Grandmother   . Hypertension Maternal Grandmother    . Heart disease Maternal Grandmother   . Depression Maternal Grandfather   . Arthritis Paternal Grandmother   . Cancer Paternal Grandmother   . Depression Paternal Grandmother   . Breast cancer Paternal Grandmother        76's  . Depression Paternal Grandfather   . Other Neg Hx     Medications Prior to Admission  Medication Sig Dispense Refill Last Dose  . Lidocaine-Glycerin (PREPARATION H EX) Apply 1 application topically daily as needed (hemorrhoids).     . Prenatal Vit-Fe Fumarate-FA (PRENATAL VITAMIN PO) Take 1 tablet by mouth daily.      . Blood Pressure KIT BP to be monitored regularly at home O09.90 Large 1 kit 0   . Blood Pressure Monitoring (BLOOD PRESSURE KIT) DEVI 1 Device by Does not apply route as needed. 1 Device 0   . cyclobenzaprine (FLEXERIL) 10 MG tablet Take 1 tablet (10 mg total) by mouth 2 (two) times daily as needed for muscle spasms. (Patient not taking: Reported on 01/02/2019) 20 tablet 0 Not Taking at Unknown time  . ursodiol (ACTIGALL) 300 MG capsule Take 1 capsule (300 mg total) by mouth 2 (two) times daily. (Patient not taking: Reported on 01/02/2019) 60 capsule 4 Not Taking at Unknown time  . ursodiol (ACTIGALL) 500 MG tablet Take 1 tablet (500 mg total) by mouth 2 (two) times daily. (Patient not taking: Reported on 01/01/2019) 60 tablet 3 Not Taking at Unknown time    Allergies  Allergen Reactions  . Tomato Hives    Review of Systems: Negative except for what is mentioned in HPI.  Physical Exam: BP (!) 141/81   Pulse 88   Temp 98.6 F (37 C) (Oral)   Resp 20   Ht _0  (1.626 m)   Wt 93 kg   LMP 04/25/2018   SpO2 100%   BMI 35.19 kg/m  FHR by Doppler: 140 bpm CONSTITUTIONAL: Well-developed, well-nourished female in no acute distress.  HENT:  Normocephalic, atraumatic, External right and left ear normal. Oropharynx is clear and moist EYES: Conjunctivae and EOM are normal. Pupils are equal, round, and reactive to light. No scleral icterus.   NECK: Normal range of motion, supple, no masses SKIN: Skin is warm and dry. No rash noted. Not diaphoretic. No erythema. No pallor. [place]: Alert and oriented to person, place, and time. Normal reflexes, muscle tone coordination. No cranial nerve deficit noted. PSYCHIATRIC: Normal mood and affect. Normal behavior. Normal judgment and thought content. CARDIOVASCULAR: Normal heart rate noted, regular rhythm RESPIRATORY: Effort and breath  sounds normal, no problems with respiration noted ABDOMEN: Soft, nontender, nondistended, gravid. Well-healed Pfannenstiel incision. PELVIC: Deferred MUSCULOSKELETAL: Normal range of motion. No edema and no tenderness. 2+ distal pulses.   Pertinent Labs/Studies:   Results for orders placed or performed during the hospital encounter of 01/09/19 (from the past 72 hour(s))  Prepare RBC     Status: None   Collection Time: 01/09/19  8:32 AM  Result Value Ref Range   Order Confirmation      ORDER PROCESSED BY BLOOD BANK Performed at [facility], [place] [address] [place], [place] [address]   Prepare RBC (crossmatch)     Status: None   Collection Time: 01/09/19  8:38 AM  Result Value Ref Range   Order Confirmation      ORDER PROCESSED BY BLOOD BANK Performed at [facility], [place] [address], [place], [place] [address]     Assessment and Plan :[name] is a 33 y.o. [phone] at 87w0dbeing admitted being admitted for scheduled cesarean section. The risks of cesarean section discussed with the patient included but were not limited to: bleeding which may require transfusion or reoperation; infection which may require antibiotics; injury to bowel, bladder, ureters or other surrounding organs; injury to the fetus; need for additional procedures including hysterectomy in the event of a life-threatening hemorrhage; placental abnormalities wth subsequent pregnancies, incisional problems, thromboembolic phenomenon and other  postoperative/anesthesia complications. The patient concurred with the proposed plan, giving informed written consent for the procedure.Patient also desires permanent sterilization.  Other reversible forms of contraception were discussed with patient; she declines all other modalities. Risks of procedure discussed with patient including but not limited to: risk of regret, permanence of method, bleeding, infection, injury to surrounding organs and need for additional procedures.  Failure risk of 1-2% with increased risk of ectopic gestation if pregnancy occurs was also discussed with patient.  The patient concurred with the proposed plan, giving informed written consent for the procedures.  Patient has been NPO since last night she will remain NPO for procedure. Anesthesia and OR aware. Preoperative prophylactic antibiotics and SCDs ordered on call to the OR. To OR when ready.    KCaren Macadam MD, MPH, ABFM Attending Physician Center [facility]

## 2019-01-09 NOTE — Transfer of Care (Signed)
Immediate Anesthesia Transfer of Care Note  Patient: [name]  Procedure(s) Performed: CESAREAN SECTION WITH BILATERAL TUBAL LIGATION (Bilateral )  Patient Location: PACU  Anesthesia Type:Spinal  Level of Consciousness: awake, alert  and oriented  Airway & Oxygen Therapy: Patient Spontanous Breathing  Post-op Assessment: Report given to RN and Post -op Vital signs reviewed and stable  Post vital signs: Reviewed and stable  Last Vitals:  Vitals Value Taken Time  BP    Temp    Pulse    Resp    SpO2      Last Pain:  Vitals:   01/09/19 0748  TempSrc: Oral         Complications: No apparent anesthesia complications

## 2019-01-09 NOTE — Anesthesia Procedure Notes (Signed)
Spinal  Patient location during procedure: OR Start time: 01/09/2019 9:27 AM End time: 01/09/2019 9:33 AM Staffing Anesthesiologist: Josephine Igo, MD Performed: anesthesiologist  Preanesthetic Checklist Completed: patient identified, site marked, surgical consent, pre-op evaluation, timeout performed, IV checked, risks and benefits discussed and monitors and equipment checked Spinal Block Patient position: sitting Prep: site prepped and draped and DuraPrep Patient monitoring: heart rate, cardiac monitor, continuous pulse ox and blood pressure Approach: midline Location: L3-4 Injection technique: single-shot Needle Needle type: Pencan  Needle gauge: 24 G Needle length: 9 cm Assessment Sensory level: T4 Additional Notes Patient tolerated procedure well. Adequate sensory level.

## 2019-01-09 NOTE — Discharge Summary (Signed)
OB Discharge Summary     Patient Name: [name] DOB: 03-29-1985 MRN: [id number]  Date of admission: 01/09/2019 Delivering MD: Caren Macadam   Date of discharge: 01/11/2019  Admitting diagnosis: RCS Undesired Fertility Intrauterine pregnancy: [redacted]w[redacted]d    Secondary diagnosis:  Principal Problem:   S/P repeat low transverse C-section Active Problems:   S/P cesarean section   History of postpartum depression, currently pregnant   Supervision of high risk pregnancy, antepartum   Placenta previa antepartum in second trimester   Marijuana use   Unwanted fertility   Cholestasis of pregnancy, antepartum   [redacted] weeks gestation of pregnancy  Additional problems: none     Discharge diagnosis: Term Pregnancy Delivered and complete previa, cholestitis, history of postpartum dperession                                                                                                Post partum procedures:None  Augmentation: na  Complications: None  Hospital course:  Sceduled C/S   33y.o. yo [id number] admitted to the hospital 01/09/2019 for scheduled cesarean section with the following indication:cholestasis and complete previa.  Membrane Rupture Time/Date: 10:01 AM ,01/09/2019   Patient delivered a Viable infant.01/09/2019  Details of operation can be found in separate operative note.  Pateint had an uncomplicated postpartum course. Ferrous sulfate prescribed on discharge. She is ambulating, tolerating a regular diet, passing flatus, and urinating well. Patient is discharged home in stable condition on  01/11/19         Physical exam  Vitals:   01/10/19 0217 01/10/19 0526 01/10/19 1706 01/10/19 2123  BP: (!) 103/58 121/80 126/65 125/65  Pulse: 66 70 75 70  Resp: '18 18 16 16  ' Temp: 97.8 F (36.6 C) 97.8 F (36.6 C) 98.1 F (36.7 C) 98 F (36.7 C)  TempSrc: Oral Oral Oral Oral  SpO2:  100%  100%  Weight:      Height:       General: alert,  cooperative and no distress Lochia: appropriate Uterine Fundus: firm Incision: Healing well with no significant drainage DVT Evaluation: No evidence of DVT seen on physical exam. Labs: Lab Results  Component Value Date   WBC 13.0 (H) 01/10/2019   HGB 8.0 (L) 01/10/2019   HCT 25.6 (L) 01/10/2019   MCV 73.8 (L) 01/10/2019   PLT 321 01/10/2019   CMP Latest Ref Rng & Units 11/24/2018  Glucose 70 - 99 mg/dL 138(H)  BUN 6 - 20 mg/dL 7  Creatinine 0.44 - 1.00 mg/dL 0.48  Sodium 135 - 145 mmol/L 134(L)  Potassium 3.5 - 5.1 mmol/L 3.5  Chloride 98 - 111 mmol/L 105  CO2 22 - 32 mmol/L 18(L)  Calcium 8.9 - 10.3 mg/dL 9.1  Total Protein 6.5 - 8.1 g/dL 6.2(L)  Total Bilirubin 0.3 - 1.2 mg/dL 0.6  Alkaline Phos 38 - 126 U/L 61  AST 15 - 41 U/L 16  ALT 0 - 44 U/L 13    Discharge instruction: per After Visit Summary and "Baby and Me Booklet".  After visit meds:  Allergies as  of 01/11/2019      Reactions   Tomato Hives      Medication List    STOP taking these medications   cyclobenzaprine 10 MG tablet Commonly known as: FLEXERIL   ursodiol 300 MG capsule Commonly known as: Actigall   ursodiol 500 MG tablet Commonly known as: ACTIGALL     TAKE these medications   acetaminophen 500 MG tablet Commonly known as: TYLENOL Take 2 tablets (1,000 mg total) by mouth every 8 (eight) hours as needed for moderate pain.   Blood Pressure Kit BP to be monitored regularly at home O09.90 Large   Blood Pressure Kit Devi 1 Device by Does not apply route as needed.   dibucaine 1 % Oint Commonly known as: NUPERCAINAL Place 1 application rectally as needed for hemorrhoids.   ferrous sulfate 325 (65 FE) MG tablet Take 1 tablet (325 mg total) by mouth daily with breakfast.   ibuprofen 800 MG tablet Commonly known as: ADVIL Take 1 tablet (800 mg total) by mouth every 8 (eight) hours.   oxyCODONE 5 MG immediate release tablet Commonly known as: Oxy IR/ROXICODONE Take 1 tablet (5 mg  total) by mouth every 4 (four) hours as needed for moderate pain.   PRENATAL VITAMIN PO Take 1 tablet by mouth daily.   PREPARATION H EX Apply 1 application topically daily as needed (hemorrhoids).      PPMSG to [facility]: Please schedule this patient for Postpartum visit in: 6 weeks with the following provider: Any provider For C/S patients schedule nurse incision check in weeks 2 weeks: yes, please check on mood at this visit (perform EPDS at wound check appt) High risk pregnancy complicated by: complete previa, cholestasis Delivery mode:  CS Anticipated Birth Control:  BTS done at time of surgery PP Procedures needed: None  Schedule Integrated BH visit: yes- History of postpartum depression, needs 2 wk postpartum mood check  Diet: routine diet  Activity: Advance as tolerated. Pelvic rest for 6 weeks.   Outpatient follow up:2 weeks- Wound and mood check, 6 wk PP visit Follow up Appt: Future Appointments  Date Time Provider [place]  01/23/2019  1:15 PM [place] None  02/06/2019  9:30 AM Lavonia Drafts, MD [facility] None   Follow up Visit:No follow-ups on file.  Postpartum contraception: Tubal Ligation  Newborn Data: Live born female  Birth Weight: 2790g APGAR: 18, 9  Newborn Delivery   Birth date/time: 01/09/2019 10:01:00 Delivery type: C-Section, Low Transverse Trial of labor: No C-section categorization: Repeat      Baby Feeding: Bottle Disposition:home with mother   01/11/2019 Chauncey Mann, MD

## 2019-01-10 LAB — CBC
HCT: 25.6 % — ABNORMAL LOW (ref 36.0–46.0)
Hemoglobin: 8 g/dL — ABNORMAL LOW (ref 12.0–15.0)
MCH: 23.1 pg — ABNORMAL LOW (ref 26.0–34.0)
MCHC: 31.3 g/dL (ref 30.0–36.0)
MCV: 73.8 fL — ABNORMAL LOW (ref 80.0–100.0)
Platelets: 321 10*3/uL (ref 150–400)
RBC: 3.47 MIL/uL — ABNORMAL LOW (ref 3.87–5.11)
RDW: 15.3 % (ref 11.5–15.5)
WBC: 13 10*3/uL — ABNORMAL HIGH (ref 4.0–10.5)
nRBC: 0 % (ref 0.0–0.2)

## 2019-01-10 MED ORDER — ACETAMINOPHEN 500 MG PO TABS
1000.0000 mg | ORAL_TABLET | Freq: Four times a day (QID) | ORAL | Status: DC
  Administered 2019-01-10 – 2019-01-11 (×3): 1000 mg via ORAL
  Filled 2019-01-10 (×3): qty 2

## 2019-01-10 MED ORDER — FERROUS SULFATE 325 (65 FE) MG PO TABS
325.0000 mg | ORAL_TABLET | Freq: Every day | ORAL | Status: DC
  Administered 2019-01-11: 325 mg via ORAL
  Filled 2019-01-10: qty 1

## 2019-01-10 NOTE — Progress Notes (Signed)
Post Partum Day 1 Subjective: up ad lib, voiding, tolerating PO, + flatus and pain is mild to moderate, controlled with pain medication Patient noted some bleeding under honeycomb dressing.   Objective: Blood pressure 121/80, pulse 70, temperature 97.8 F (36.6 C), temperature source Oral, resp. rate 18, height 5\' 4"  (1.626 m), weight 93 kg, last menstrual period 04/25/2018, SpO2 100 %, unknown if currently breastfeeding.  Physical Exam:  General: alert and cooperative Lochia: appropriate Uterine Fundus: firm Incision: healing well, no dehiscence, no significant erythema, small blood noted under honeycomb DVT Evaluation: No evidence of DVT seen on physical exam. No cords or calf tenderness. No significant calf/ankle edema.  Recent Labs    01/10/19 0522  HGB 8.0*  HCT 25.6*    Assessment/Plan: Contraception BTL with C/S  Pain is well-controlled with pain medication as prescribed  Ferrous sulfate added today for Hgb drop from 9.4>8.0   LOS: 1 day   Kerry Hough 01/10/2019, 1:28 PM

## 2019-01-10 NOTE — Clinical Social Work Maternal (Signed)
CLINICAL SOCIAL WORK MATERNAL/CHILD NOTE  Patient Details  Name: [name] MRN: [id number] Date of Birth: October 08, 1985  Date:  01/10/2019  Clinical Social Worker Initiating Note:  Elijio Miles Date/Time: Initiated:  01/10/19/1240     Child's Name:  [name]   Biological Parents:  Mother, Father([name] and [name] DOB: 07/13/1985)   Need for Interpreter:  None   Reason for Referral:  Behavioral Health Concerns, Current Substance Use/Substance Use During Pregnancy    Address:  [place] [address] (mailing address)  [address], [place], [place] [address] (address MOB will discharge to)   Phone number:  [phone] (home)     Additional phone number:   Household Members/Support Persons (HM/SP):   Household Member/Support Person 1, Household Member/Support Person 2   HM/SP Name Relationship DOB or Age  HM/SP -1 Kamari Skelley Son 51-years-old  HM/SP -2 Tequioa Westermeyer Sister    HM/SP -3        HM/SP -4        HM/SP -5        HM/SP -6        HM/SP -7        HM/SP -8          Natural Supports (not living in the home):  Immediate Family, Spouse/significant other   Professional Supports: None   Employment: Animator   Type of Work: [employer]   Education:  Forensic psychologist   Homebound arranged:    Museum/gallery curator Resources:  Medicaid   Other Resources:  Physicist, medical , [place] Considerations Which May Impact Care:    Strengths:  Ability to meet basic needs , Home prepared for child , Pediatrician chosen   Psychotropic Medications:         Pediatrician:    [place] (including [place])  Pediatrician List:   [place])    Pediatrician Fax Number:    Risk Factors/Current Problems:  Substance Use    Cognitive State:  Able to Concentrate  , Alert , Linear Thinking    Mood/Affect:  Calm , Comfortable , Interested , Relaxed    CSW Assessment:  CSW received consult for THC use and history of PPD.  CSW met with MOB to offer support and complete assessment.    MOB laying in bed with FOB present at bedside holding infant, when CSW entered the room. CSW introduced self and requested FOB step out of the room while CSW completed assessment. MOB asked that FOB be able to stay and gave CSW permission to discuss anything in front of FOB. CSW explained reason for consult to which MOB expressed understanding. MOB stated she currently lives with her sister and 73-year-old son and works full-time for [employer]. CSW inquired about MOB's mental health history and MOB acknowledged history of PPD with her son but reported she has not felt that way with infant this time. MOB explained she had difficulty bonding and caring for her son when he was born. MOB reported symptoms lasted for about 2 months before going away. MOB denied any recent symptoms, any current medications and any current counseling. CSW provided education regarding the baby blues period vs. perinatal mood disorders, discussed treatment and gave resources for mental health follow up if concerns arise.  CSW recommends self-evaluation during the postpartum  time period using the New Mom Checklist from Postpartum Progress and encouraged MOB to contact a medical professional if symptoms are noted at any time. MOB did not appear to be displaying any acute mental health symptoms and denied any current SI or HI. MOB reported having good support from her sister, her mother and FOB.   CSW inquired about MOB's substance use during pregnancy and MOB reported she hasn't used marijuana since she was 3 months pregnant. Per MOB, she used marijuana to help with her appetite. CSW informed MOB of Hospital Drug Policy and MOB appeared concerned. CSW explained UDS came back negative but that CDS would continue to be  monitored and a CPS report would be made, if warranted. MOB then asked CSW if CPS would be coming out to her house because she works 11am-11pm and doesn't have the time. CSW explained if infant tested positive then a report would be made to [place] and they would work out a time to meet. MOB asked appropriate questions and CSW detailed what process may look like and MOB expressed understanding.   MOB confirmed having all essential items for infant once discharge and reported infant would be sleeping in a bassinet once home. CSW provided review of Sudden Infant Death Syndrome (SIDS) precautions and safe sleeping habits.     CSW Plan/Description:  No Further Intervention Required/No Barriers to Discharge, Sudden Infant Death Syndrome (SIDS) Education, Perinatal Mood and Anxiety Disorder (PMADs) Education, [place], CSW Will Continue to Monitor Umbilical Cord Tissue Drug Screen Results and Make Report if [facility], [place] 01/10/2019, 1:16 PM

## 2019-01-11 DIAGNOSIS — Z3A37 37 weeks gestation of pregnancy: Secondary | ICD-10-CM

## 2019-01-11 LAB — TYPE AND SCREEN
ABO/RH(D): AB POS
Antibody Screen: NEGATIVE
Unit division: 0
Unit division: 0

## 2019-01-11 LAB — BPAM RBC
Blood Product Expiration Date: 202011222359
Blood Product Expiration Date: 202012022359
Unit Type and Rh: 8400
Unit Type and Rh: 8400

## 2019-01-11 LAB — SURGICAL PATHOLOGY

## 2019-01-11 MED ORDER — OXYCODONE HCL 5 MG PO TABS: 5.0000 mg | ORAL_TABLET | ORAL | 0 refills | Status: AC | PRN

## 2019-01-11 MED ORDER — DIBUCAINE (PERIANAL) 1 % EX OINT: 1.0000 "application " | TOPICAL_OINTMENT | CUTANEOUS | 0 refills | Status: AC | PRN

## 2019-01-11 MED ORDER — IBUPROFEN 800 MG PO TABS: 800.0000 mg | ORAL_TABLET | Freq: Three times a day (TID) | ORAL | 0 refills | Status: AC

## 2019-01-11 MED ORDER — FERROUS SULFATE 325 (65 FE) MG PO TABS: 325.0000 mg | ORAL_TABLET | Freq: Every day | ORAL | 3 refills | Status: AC

## 2019-01-11 MED ORDER — ACETAMINOPHEN 500 MG PO TABS: 1000.0000 mg | ORAL_TABLET | Freq: Three times a day (TID) | ORAL | 0 refills | Status: AC | PRN

## 2019-01-15 ENCOUNTER — Telehealth: Payer: Self-pay

## 2019-01-15 NOTE — Telephone Encounter (Signed)
  Returned call to patient, she had question about sex. Patient was advised to wait until her PP check up to get the green light from her Provider.

## 2019-01-23 ENCOUNTER — Encounter: Payer: Self-pay | Admitting: Obstetrics

## 2019-01-23 ENCOUNTER — Other Ambulatory Visit: Payer: Self-pay

## 2019-01-23 ENCOUNTER — Ambulatory Visit (INDEPENDENT_AMBULATORY_CARE_PROVIDER_SITE_OTHER): Payer: Medicaid Other | Admitting: Obstetrics

## 2019-01-23 NOTE — Progress Notes (Signed)
Subjective:     [name] is a 33 y.o. female who presents for a postpartum visit. She is 2 weeks postpartum following a low cervical transverse Cesarean section and Filshie Clip tubal sterilization. I have fully reviewed the prenatal and intrapartum course. The delivery was at 21 gestational weeks for placenta previa. Outcome: repeat cesarean section, low transverse incision and tubal sterilization. Anesthesia: spinal. Postpartum course has been normal. Baby's course has been normal. Baby is feeding by breast. Bleeding thin lochia. Bowel function is normal. Bladder function is normal. Patient is not sexually active. Contraception method is abstinence. Postpartum depression screening: negative.  Tobacco, alcohol and substance abuse history reviewed.  Adult immunizations reviewed including TDAP, rubella and varicella.  The following portions of the patient's history were reviewed and updated as appropriate: allergies, current medications, past family history, past medical history, past social history, past surgical history and problem list.  Review of Systems A comprehensive review of systems was negative.   Objective:    LMP 04/25/2018   General:  alert and no distress   Breasts:  inspection negative, no nipple discharge or bleeding, no masses or nodularity palpable  Lungs: clear to auscultation bilaterally  Heart:  regular rate and rhythm, S1, S2 normal, no murmur, click, rub or gallop  Abdomen: soft, non-tender; bowel sounds normal; no masses,  no organomegaly and incision is clean, dry and intact   Vulva:  not evaluated  Vagina: not evaluated  Cervix:  not evaluated  Corpus: not examined  Adnexa:  not evaluated  Rectal Exam: Not performed.          50% of 15 min visit spent on counseling and coordination of care.  Assessment:     1. Postpartum care following cesarean delivery and tubal sterilization ( Filshie Clip ) - doing well   Plan:    1. Contraception: Filshie  Clip starilization done at C/S 2. Continue PNV's 3. Follow up in: 2 weeks or as needed.  4. Healthy lifestyle practices reviewed  Shelly Bombard, MD 01/23/2019 1:50 PM

## 2019-01-23 NOTE — Progress Notes (Signed)
Pt is here for a incision check.  Denies fever and chills. Pt c/o lower abdominal pain while driving and trace of edema in bilateral feet.  Pt works 12 to 13 hour shifts.  I recommend that she elevate her feet as much as possible. Consulted with Dr. Jodi Mourning.  He feels that the incision looks great.  He suggested that the lower abdominal is coming from bending while driving. Pt advised to make f/u PP appointment in 4 weeks. Pt verbalized understanding. -EH/RMA

## 2019-02-06 ENCOUNTER — Encounter: Payer: Self-pay | Admitting: Obstetrics & Gynecology

## 2019-02-06 ENCOUNTER — Ambulatory Visit (INDEPENDENT_AMBULATORY_CARE_PROVIDER_SITE_OTHER): Payer: Medicaid Other | Admitting: Obstetrics & Gynecology

## 2019-02-06 ENCOUNTER — Other Ambulatory Visit: Payer: Self-pay

## 2019-02-06 DIAGNOSIS — F53 Postpartum depression: Secondary | ICD-10-CM

## 2019-02-06 DIAGNOSIS — O99345 Other mental disorders complicating the puerperium: Secondary | ICD-10-CM

## 2019-02-06 NOTE — Progress Notes (Signed)
TELEHEALTH POSTPARTUM VIRTUAL VIDEO VISIT ENCOUNTER NOTE   Provider location: [facility] at [place]   I connected with [name] on 02/06/19 at  9:30 AM EST by WebEx Video Encounter at home and verified that I am speaking with the correct person using two identifiers.    I discussed the limitations, risks, security and privacy concerns of performing an evaluation and management service virtually and the availability of in person appointments. I also discussed with the patient that there may be a patient responsible charge related to this service. The patient expressed understanding and agreed to proceed.  Chief Complaint: Postpartum Visit  History of Present Illness: [name] is a 33 y.o. African-American [id number] being evaluated for postpartum followup.    She is s/p repeat cesarean section on 01/09/19 at 37 weeks; she was discharged to home on 01/11/2019. Pregnancy complicated by PP depression in a prev pregnancy. Pt was placed on meds prev but, did not like them.  Baby is doing well.  Complains of 'baby blues'  She reports being sexually active. Wonders if this contributed to her depression.    Vaginal bleeding or discharge: No  Intercourse: Yes  Contraception: bilateral tubal ligation Mode of feeding infant: Bottle PP depression s/s: Yes . (score:15) Any bowel or bladder issues: No  Pap smear: no abnormalities (date: 07/13/2018)  Review of Systems: Positive for PP depression. Her 12 point review of systems is negative or as noted in the History of Present Illness.  Patient Active Problem List   Diagnosis Date Noted  . [redacted] weeks gestation of pregnancy 01/11/2019  . S/P repeat low transverse C-section 01/09/2019  . Cholestasis of pregnancy, antepartum 12/27/2018  . Unwanted fertility 11/27/2018  . S/P cesarean section 09/28/2018  . History of postpartum depression, currently pregnant 09/28/2018  . Supervision of high risk  pregnancy, antepartum 09/28/2018  . Placenta previa antepartum in second trimester 09/28/2018  . Marijuana use 09/28/2018  . PAP SMEAR, ABNORMAL 06/21/2004    Medications [name] had no medications administered during this visit. Current Outpatient Medications  Medication Sig Dispense Refill  . acetaminophen (TYLENOL) 500 MG tablet Take 2 tablets (1,000 mg total) by mouth every 8 (eight) hours as needed for moderate pain. 30 tablet 0  . Blood Pressure KIT BP to be monitored regularly at home O09.90 Large 1 kit 0  . Blood Pressure Monitoring (BLOOD PRESSURE KIT) DEVI 1 Device by Does not apply route as needed. 1 Device 0  . dibucaine (NUPERCAINAL) 1 % OINT Place 1 application rectally as needed for hemorrhoids. 28 g 0  . ferrous sulfate 325 (65 FE) MG tablet Take 1 tablet (325 mg total) by mouth daily with breakfast. 30 tablet 3  . ibuprofen (ADVIL) 800 MG tablet Take 1 tablet (800 mg total) by mouth every 8 (eight) hours. 30 tablet 0  . Lidocaine-Glycerin (PREPARATION H EX) Apply 1 application topically daily as needed (hemorrhoids).    . oxyCODONE (OXY IR/ROXICODONE) 5 MG immediate release tablet Take 1 tablet (5 mg total) by mouth every 4 (four) hours as needed for moderate pain. 20 tablet 0  . Prenatal Vit-Fe Fumarate-FA (PRENATAL VITAMIN PO) Take 1 tablet by mouth daily.      No current facility-administered medications for this visit.     Allergies Tomato  Physical Exam:  LMP 04/25/2018  General:  Alert, oriented and cooperative. Patient is in no acute distress.  Mental Status: Normal mood and affect. Normal behavior. Normal judgment  and thought content.   Respiratory: Normal respiratory effort noted, no problems with respiration noted  Rest of physical exam deferred due to type of encounter  PP Depression Screening:   Edinburgh Postnatal Depression Scale Screening Tool 01/23/2019 01/09/2019  I have been able to laugh and see the funny side of things. 0 0  I have  looked forward with enjoyment to things. 0 1  I have blamed myself unnecessarily when things went wrong. 0 2  I have been anxious or worried for no good reason. 0 0  I have felt scared or panicky for no good reason. 0 0  Things have been getting on top of me. 0 1  I have been so unhappy that I have had difficulty sleeping. 0 2  I have felt sad or miserable. 0 0  I have been so unhappy that I have been crying. 0 1  The thought of harming myself has occurred to me. 0 0  Edinburgh Postnatal Depression Scale Total 0 7     Assessment:Patient is a 33 y.o. [id number] who is 4 weeks postpartum from a repeat cesarean section.  She is doing well. Requests referral to behavioral health.  Elevated depression screen- declines meds   Plan: Referral to [facility] There are no diagnoses linked to this encounter.  RTC 4 weeks  I discussed the assessment and treatment plan with the patient. The patient was provided an opportunity to ask questions and all were answered. The patient agreed with the plan and demonstrated an understanding of the instructions.   The patient was advised to call back or seek an in-person evaluation/go to the ED for any concerning postpartum symptoms.  I provided 12 minutes of face-to-face time during this encounter.   Cleotilde Neer, RN [facility], [place]

## 2019-02-19 ENCOUNTER — Telehealth: Payer: Self-pay

## 2019-02-19 NOTE — Telephone Encounter (Signed)
Pt called and states that her first period since delivery and BTL has lasted for 9 days so far and she is soaking through 1-2 pads per hour. Pt denies any other symptoms such as light headedness or dizziness. I advised pt that I will send a message to her provider to see what the recommendations are to stop the bleeding.

## 2019-02-21 NOTE — Telephone Encounter (Signed)
Pt reports she is no longer bleeding at this time, denies any other symptoms. Advised pt to call back if she starts bleeding again and we can prescribe her contraceptive pills to take every 8 hours per Dr. Jodi Mourning. Pt verbalizes understanding.

## 2019-02-21 NOTE — Telephone Encounter (Signed)
We can Rx a pack of OCP's to take every 8 hours until finished pack.

## 2019-02-22 ENCOUNTER — Other Ambulatory Visit: Payer: Self-pay

## 2019-02-22 DIAGNOSIS — N939 Abnormal uterine and vaginal bleeding, unspecified: Secondary | ICD-10-CM

## 2019-02-22 MED ORDER — NORETHINDRONE-ETH ESTRADIOL 1-35 MG-MCG PO TABS: 1.0000 | ORAL_TABLET | Freq: Every day | ORAL | 11 refills | Status: AC

## 2019-02-22 NOTE — Progress Notes (Signed)
Continuous contraceptive pills sent to patients pharmacy per Dr. Jodi Mourning for abnormal bleeding.

## 2020-11-03 ENCOUNTER — Emergency Department (HOSPITAL_COMMUNITY)
Admission: EM | Admit: 2020-11-03 | Discharge: 2020-11-04 | Disposition: A | Discharge status: Home / Self Care | Payer: Medicaid Other | Attending: Emergency Medicine | Admitting: Emergency Medicine

## 2020-11-03 DIAGNOSIS — N644 Mastodynia: Secondary | ICD-10-CM | POA: Diagnosis present

## 2020-11-03 DIAGNOSIS — Z87891 Personal history of nicotine dependence: Secondary | ICD-10-CM | POA: Insufficient documentation

## 2020-11-03 DIAGNOSIS — N611 Abscess of the breast and nipple: Secondary | ICD-10-CM | POA: Insufficient documentation

## 2020-11-03 NOTE — ED Notes (Signed)
Pt ambulatory in triage. 

## 2020-11-03 NOTE — ED Provider Notes (Signed)
Emergency Medicine Provider Triage Evaluation Note  [name] , a 35 y.o. female  was evaluated in triage.  Pt complains of breast mass on R side. Pt had nippled pierced 9 months ago, has had no problems since. Has had drainage from pieced site since Sunday. Pain has becoming worse. No fevers. Not breastfeeding.   Review of Systems  Positive: Breast abscess  Negative: fevers  Physical Exam  BP (!) 178/88 (BP Location: Left Arm)   Pulse 61   Temp 98.2 F (36.8 C) (Oral)   Resp 18   Ht 5\' 4"  (1.626 m)   Wt 82.6 kg   SpO2 100%   BMI 31.24 kg/m  Gen:   Awake, no distress   Resp:  Normal effort  MSK:   Moves extremities without difficulty  Other:  R breast with abscess.   Medical Decision Making  Medically screening exam initiated at 11:47 PM.  Appropriate orders placed.  [name] was informed that the remainder of the evaluation will be completed by another provider, this initial triage assessment does not replace that evaluation, and the importance of remaining in the ED until their evaluation is complete.    Orland Dec, PA-C 11/03/20 2349    11/05/20, MD 11/04/20 [phone]

## 2020-11-04 ENCOUNTER — Other Ambulatory Visit: Payer: Self-pay

## 2020-11-04 ENCOUNTER — Encounter (HOSPITAL_COMMUNITY): Payer: Self-pay

## 2020-11-04 ENCOUNTER — Telehealth: Payer: Self-pay | Admitting: *Deleted

## 2020-11-04 MED ORDER — NAPROXEN 375 MG PO TABS: 375.0000 mg | ORAL_TABLET | Freq: Two times a day (BID) | ORAL | 0 refills | Status: AC

## 2020-11-04 MED ORDER — AMOXICILLIN-POT CLAVULANATE 875-125 MG PO TABS
1.0000 | ORAL_TABLET | Freq: Once | ORAL | Status: AC
  Administered 2020-11-04: 1 via ORAL
  Filled 2020-11-04: qty 1

## 2020-11-04 MED ORDER — SULFAMETHOXAZOLE-TRIMETHOPRIM 800-160 MG PO TABS: 1.0000 | ORAL_TABLET | Freq: Two times a day (BID) | ORAL | 0 refills | Status: AC

## 2020-11-04 MED ORDER — AMOXICILLIN-POT CLAVULANATE 875-125 MG PO TABS: 1.0000 | ORAL_TABLET | Freq: Two times a day (BID) | ORAL | 0 refills | Status: AC

## 2020-11-04 MED ORDER — HYDROCODONE-ACETAMINOPHEN 5-325 MG PO TABS: 1.0000 | ORAL_TABLET | Freq: Four times a day (QID) | ORAL | 0 refills | Status: AC | PRN

## 2020-11-04 MED ORDER — SULFAMETHOXAZOLE-TRIMETHOPRIM 800-160 MG PO TABS
1.0000 | ORAL_TABLET | Freq: Once | ORAL | Status: AC
  Administered 2020-11-04: 1 via ORAL
  Filled 2020-11-04: qty 1

## 2020-11-04 MED ORDER — KETOROLAC TROMETHAMINE 15 MG/ML IJ SOLN
15.0000 mg | Freq: Once | INTRAMUSCULAR | Status: AC
  Administered 2020-11-04: 15 mg via INTRAMUSCULAR
  Filled 2020-11-04: qty 1

## 2020-11-04 NOTE — ED Provider Notes (Signed)
[facility] DEPT Provider Note   CSN: [id number] Arrival date & time: 11/03/20  2221     History Chief Complaint  Patient presents with   Abscess    [name] is a 35 y.o. female.  35 year old female presents to the emergency department for further evaluation of right sided breast pain.  She has had some increased swelling and redness associated with her pain since onset 3 days ago.  Pain has remained constant.  It will be temporarily improved with a BC powder.  It is aggravated by palpation.  She reports a scant amount of drainage from the site of her nipple piercing.  Piercing was completed 9 months ago.  She has not had any fevers, nipple discharge.  Is not actively breast-feeding.  The history is provided by the patient. No language interpreter was used.  Abscess     Past Medical History:  Diagnosis Date   Anemia    Blood transfusion    can't remember dates but NOT with last pregnancy   Depression    Postpartum   Ectopic pregnancy    Menorrhagia 2007   blood transfusion    Patient Active Problem List   Diagnosis Date Noted   [redacted] weeks gestation of pregnancy 01/11/2019   S/P repeat low transverse C-section 01/09/2019   Cholestasis of pregnancy, antepartum 12/27/2018   Unwanted fertility 11/27/2018   S/P cesarean section 09/28/2018   History of postpartum depression, currently pregnant 09/28/2018   Supervision of high risk pregnancy, antepartum 09/28/2018   Placenta previa antepartum in second trimester 09/28/2018   Marijuana use 09/28/2018   PAP SMEAR, ABNORMAL 06/21/2004    Past Surgical History:  Procedure Laterality Date   CESAREAN SECTION  03/28/2012   Procedure: CESAREAN SECTION;  Surgeon: Frederico Hamman, MD;  Location: [facility];  Service: Obstetrics;  Laterality: N/A;  Primary Cesarean Section Delivery Baby Boy @ [phone], Apgars 8/8   CESAREAN SECTION WITH BILATERAL TUBAL LIGATION Bilateral 01/09/2019    Procedure: CESAREAN SECTION WITH BILATERAL TUBAL LIGATION;  Surgeon: Caren Macadam, MD;  Location: [facility];  Service: Obstetrics;  Laterality: Bilateral;   INDUCED ABORTION     WISDOM TOOTH EXTRACTION  2013     OB History     Gravida  4   Para  2   Term  2   Preterm      AB  2   Living  2      SAB      IAB  1   Ectopic  1   Multiple  0   Live Births  2           Family History  Problem Relation Age of Onset   Arthritis Father    Diabetes Father    Hypertension Father    Heart disease Father    Arthritis Maternal Grandmother    Depression Maternal Grandmother    Hypertension Maternal Grandmother    Heart disease Maternal Grandmother    Depression Maternal Grandfather    Arthritis Paternal Grandmother    Cancer Paternal Grandmother    Depression Paternal Grandmother    Breast cancer Paternal Grandmother        68's   Depression Paternal Grandfather    Other Neg Hx     Social History   Tobacco Use   Smoking status: Former   Smokeless tobacco: Never  Scientific laboratory technician Use: Never used  Substance Use Topics   Alcohol use: No  Drug use: Yes    Types: Marijuana    Comment: last used over a 1 month ago    Home Medications Prior to Admission medications   Medication Sig Start Date End Date Taking? Authorizing Provider  amoxicillin-clavulanate (AUGMENTIN) 875-125 MG tablet Take 1 tablet by mouth every 12 (twelve) hours. 11/04/20  Yes Antonietta Breach, PA-C  Blood Pressure KIT BP to be monitored regularly at home O09.90 Large 10/01/18  Yes Constant, Peggy, MD  Blood Pressure Monitoring (BLOOD PRESSURE KIT) DEVI 1 Device by Does not apply route as needed. 12/11/18  Yes Chancy Milroy, MD  HYDROcodone-acetaminophen (NORCO/VICODIN) 5-325 MG tablet Take 1 tablet by mouth every 6 (six) hours as needed for severe pain. 11/04/20  Yes Antonietta Breach, PA-C  Lidocaine-Glycerin (PREPARATION H EX) Apply 1 application topically daily as needed (hemorrhoids).    Yes [provider]  naproxen (NAPROSYN) 375 MG tablet Take 1 tablet (375 mg total) by mouth 2 (two) times daily with a meal. 11/04/20  Yes Antonietta Breach, PA-C  sulfamethoxazole-trimethoprim (BACTRIM DS) 800-160 MG tablet Take 1 tablet by mouth 2 (two) times daily for 7 days. 11/04/20 11/11/20 Yes Antonietta Breach, PA-C  acetaminophen (TYLENOL) 500 MG tablet Take 2 tablets (1,000 mg total) by mouth every 8 (eight) hours as needed for moderate pain. Patient not taking: No sig reported 01/11/19   Fair, Marin Shutter, MD  dibucaine (NUPERCAINAL) 1 % OINT Place 1 application rectally as needed for hemorrhoids. Patient not taking: No sig reported 01/11/19   Chauncey Mann, MD  ferrous sulfate 325 (65 FE) MG tablet Take 1 tablet (325 mg total) by mouth daily with breakfast. Patient not taking: No sig reported 01/11/19 05/11/19  Fair, Marin Shutter, MD  ibuprofen (ADVIL) 800 MG tablet Take 1 tablet (800 mg total) by mouth every 8 (eight) hours. Patient not taking: No sig reported 01/11/19   Chauncey Mann, MD  norethindrone-ethinyl estradiol 1/35 (ORTHO-NOVUM) tablet Take 1 tablet by mouth daily. Patient not taking: Reported on 11/04/2020 02/22/19   Shelly Bombard, MD  oxyCODONE (OXY IR/ROXICODONE) 5 MG immediate release tablet Take 1 tablet (5 mg total) by mouth every 4 (four) hours as needed for moderate pain. Patient not taking: No sig reported 01/11/19   FairMarin Shutter, MD    Allergies    Tomato  Review of Systems   Review of Systems Ten systems reviewed and are negative for acute change, except as noted in the HPI.    Physical Exam Updated Vital Signs BP (!) 155/95   Pulse (!) 53   Temp 98.2 F (36.8 C) (Oral)   Resp 20   Ht '5\' 4"'  (1.626 m)   Wt 82.6 kg   SpO2 100%   BMI 31.24 kg/m   Physical Exam Vitals and nursing note reviewed.  Constitutional:      General: She is not in acute distress.    Appearance: She is well-developed. She is not diaphoretic.     Comments: Nontoxic  appearing and in NAD  HENT:     Head: Normocephalic and atraumatic.  Eyes:     General: No scleral icterus.    Conjunctiva/sclera: Conjunctivae normal.  Cardiovascular:     Rate and Rhythm: Normal rate and regular rhythm.     Pulses: Normal pulses.  Pulmonary:     Effort: Pulmonary effort is normal. No respiratory distress.     Comments: Respirations even and unlabored Chest:       Comments: Piercing of nipple of right  breast. No active drainage. There is associated erythema, induration, warmth, and tenderness of the right nipple and areola extending to the superior breast tissue.  No appreciable fluctuance.  No nipple discharge or drainage from piercing site.  No nipple inversion. Musculoskeletal:        General: Normal range of motion.     Cervical back: Normal range of motion.  Skin:    General: Skin is warm and dry.     Coloration: Skin is not pale.     Findings: No erythema or rash.  Neurological:     Mental Status: She is alert and oriented to person, place, and time.     Coordination: Coordination normal.  Psychiatric:        Behavior: Behavior normal.    ED Results / Procedures / Treatments   Labs (all labs ordered are listed, but only abnormal results are displayed) Labs Reviewed - No data to display  EKG None  Radiology No results found.  Procedures Procedures   Medications Ordered in ED Medications  ketorolac (TORADOL) 15 MG/ML injection 15 mg (15 mg Intramuscular Given 11/04/20 0431)  amoxicillin-clavulanate (AUGMENTIN) 875-125 MG per tablet 1 tablet (1 tablet Oral Given 11/04/20 0431)  sulfamethoxazole-trimethoprim (BACTRIM DS) 800-160 MG per tablet 1 tablet (1 tablet Oral Given 11/04/20 0431)    ED Course  I have reviewed the triage vital signs and the nursing notes.  Pertinent labs & imaging results that were available during my care of the patient were reviewed by me and considered in my medical decision making (see chart for details).    MDM  Rules/Calculators/A&P                           35 year old female presents to the emergency department with concern for breast cellulitis and/or abscess.  She has no active drainage at this time, no nipple discharge.  Is afebrile without concern for SIRS/sepsis.  The patient is followed by a primary care doctor.  Will refer to the [facility] for needed ultrasound.  Started on Augmentin and Bactrim for bacterial coverage.  Also provided information for [facility] should patient necessitate operative drainage.  Return precautions discussed and provided. Patient discharged in stable condition with no unaddressed concerns.   Final Clinical Impression(s) / ED Diagnoses Final diagnoses:  Breast abscess of female    Rx / DC Orders ED Discharge Orders          Ordered    amoxicillin-clavulanate (AUGMENTIN) 875-125 MG tablet  Every 12 hours        11/04/20 0435    sulfamethoxazole-trimethoprim (BACTRIM DS) 800-160 MG tablet  2 times daily        11/04/20 0435    naproxen (NAPROSYN) 375 MG tablet  2 times daily with meals        11/04/20 0437    HYDROcodone-acetaminophen (NORCO/VICODIN) 5-325 MG tablet  Every 6 hours PRN        11/04/20 0437             Antonietta Breach, PA-C 11/04/20 0618    Maudie Flakes, MD 11/04/20 [phone]

## 2020-11-04 NOTE — ED Notes (Signed)
Patient states she is leaving after she receives medication. Explained to patient that she is not up for discharge yet and that would be a AMA.

## 2020-11-04 NOTE — Discharge Instructions (Addendum)
Take the antibiotics prescribed to you until finished.  We recommend follow-up with the breast imaging center to assess for cellulitis versus abscess.  Imaging can be followed by your primary care doctor, though you may require follow-up with general surgery should you have an abscess requiring drainage.  Take naproxen as prescribed for pain.  You may use Norco for management of severe pain.  Do not drive or drink alcohol after taking this medication as it may make you drowsy and impair your judgment.  Return for new or concerning symptoms.

## 2020-11-04 NOTE — Telephone Encounter (Signed)
Pt called regarding narcotic not being in stock at pharmacy.  RNCM offered to contact EDP to switch pharmacy but pt decided to manage pain with OTC Tylenol. Pt advised to seek medical attention should pain not controlled by OTC.

## 2020-11-04 NOTE — ED Triage Notes (Signed)
Pt reports right breast abscess x 2 days.
